# Patient Record
Sex: Male | Born: 1995 | Hispanic: Yes | Marital: Single | Smoking: Former smoker
Health system: Southern US, Community
[De-identification: ages and names within clinical notes are randomized; demographics above are authoritative.]

## PROBLEM LIST (undated history)

## (undated) DIAGNOSIS — Z789 Other specified health status: Secondary | ICD-10-CM

## (undated) HISTORY — PX: NO PAST SURGERIES: SHX2092

---
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
## 2013-06-29 ENCOUNTER — Emergency Department: Payer: Self-pay | Admitting: Emergency Medicine

## 2013-08-05 ENCOUNTER — Emergency Department: Payer: Self-pay | Admitting: Emergency Medicine

## 2015-05-25 ENCOUNTER — Emergency Department: Payer: Self-pay

## 2015-05-25 DIAGNOSIS — S82832A Other fracture of upper and lower end of left fibula, initial encounter for closed fracture: Secondary | ICD-10-CM | POA: Insufficient documentation

## 2015-05-25 DIAGNOSIS — X58XXXA Exposure to other specified factors, initial encounter: Secondary | ICD-10-CM | POA: Insufficient documentation

## 2015-05-25 DIAGNOSIS — Y92322 Soccer field as the place of occurrence of the external cause: Secondary | ICD-10-CM | POA: Insufficient documentation

## 2015-05-25 DIAGNOSIS — Y9366 Activity, soccer: Secondary | ICD-10-CM | POA: Insufficient documentation

## 2015-05-25 DIAGNOSIS — Y998 Other external cause status: Secondary | ICD-10-CM | POA: Insufficient documentation

## 2015-05-25 DIAGNOSIS — S8252XA Displaced fracture of medial malleolus of left tibia, initial encounter for closed fracture: Secondary | ICD-10-CM | POA: Insufficient documentation

## 2015-05-25 NOTE — ED Notes (Signed)
Pt injured left foot and ankle playing soccer, co pain and swelling to area.

## 2015-05-26 ENCOUNTER — Encounter: Payer: Self-pay | Admitting: Emergency Medicine

## 2015-05-26 ENCOUNTER — Emergency Department
Admission: EM | Admit: 2015-05-26 | Discharge: 2015-05-26 | Disposition: A | Discharge status: Home / Self Care | Payer: Self-pay | Attending: Emergency Medicine | Admitting: Emergency Medicine

## 2015-05-26 DIAGNOSIS — S82892A Other fracture of left lower leg, initial encounter for closed fracture: Secondary | ICD-10-CM

## 2015-05-26 MED ORDER — OXYCODONE-ACETAMINOPHEN 5-325 MG PO TABS: 1.0000 | ORAL_TABLET | ORAL | Status: AC | PRN

## 2015-05-26 MED ORDER — OXYCODONE-ACETAMINOPHEN 5-325 MG PO TABS
2.0000 | ORAL_TABLET | Freq: Once | ORAL | Status: AC
  Administered 2015-05-26: 2 via ORAL
  Filled 2015-05-26: qty 2

## 2015-05-26 NOTE — Discharge Instructions (Signed)
Fractura de tobillo °(Ankle Fracture) °Una fractura es la ruptura de un hueso. La articulación del tobillo está compuesta por tres huesos. Estos incluyen las secciones inferiores (distales) de los huesos de la extremidad inferior, llamados tibia y peroné, junto con un hueso del pie, llamado astrágalo. En función de la gravedad de la fractura y si hay más de un hueso de la articulación del tobillo fracturado, se usa un yeso o una férula para proteger el hueso fracturado e impedir que este se mueva mientras se suelda. A veces, es necesario realizar la cirugía para ayudar a que la fractura suelde correctamente.  °Hay dos tipos generales de fracturas: °· Fractura estable. En este tipo de fractura hay una sola línea de la fractura que atraviesa un hueso sin lesiones en los ligamentos del tobillo. La fractura del astrágalo sin desplazamiento (movimiento del hueso hacia uno de los lados de la línea de la fractura) también es estable. °· Fractura inestable. En este tipo de fractura hay más de una línea de la fractura que atraviesan uno o más huesos de la articulación del tobillo. También incluye las fracturas con desplazamiento del hueso hacia uno de los lados de la línea de la fractura. °CAUSAS °· Un golpe directo en el tobillo. °· Una torcedura rápida y grave del tobillo. °· Un traumatismo, como un accidente automovilístico o una caída de una altura importante. °FACTORES DE RIESGO °Puede tener un riesgo más alto de sufrir una fractura de tobillo si: °· Tiene ciertas enfermedades crónicas. °· Practica deportes de alto impacto. °· Tiene un accidente automovilístico de alto impacto. °SIGNOS Y SÍNTOMAS  °· Dolor e hinchazón en el tobillo. °· Hematomas alrededor del tobillo lesionado. °· Dolor al mover el tobillo. °· Dificultad para caminar o para soportar peso en el tobillo. °· Pie frío por debajo del lugar de la lesión del tobillo. Esto puede ocurrir si los vasos sanguíneos que atraviesan el tobillo lesionado también se  dañaron. °· Adormecimiento del pie por debajo del lugar de la lesión del tobillo. °DIAGNÓSTICO  °Generalmente, la fractura de tobillo se diagnostica mediante un examen físico y radiografías. También puede ser necesario realizar una tomografía computarizada si la fractura es compleja. °TRATAMIENTO  °Para tratar las fracturas estables, se coloca un yeso o una férula y se usan muletas para no recargar el tobillo lesionado. A esto le sigue un programa de fortalecimiento del tobillo. Algunos pacientes necesitan un tipo especial de yeso, en función de otros problemas médicos que pueden tener. Las fracturas inestables requieren cirugía para asegurarse de que los huesos se suelden correctamente. El médico le informará qué tipo de fractura tiene y cuál es el mejor tratamiento para su afección. °INSTRUCCIONES PARA EL CUIDADO EN EL HOGAR  °· Revise con el médico cuál es la mejor forma de usar las muletas y úselas como se lo indiquen. El uso seguro de las muletas es muy importante. El uso indebido de las muletas puede provocarle caídas o causar lesiones en los nervios de las manos o las axilas. °· No recargue ni ejerza presión en el tobillo lesionado hasta tanto el médico se lo indique. °· Para disminuir la hinchazón, mantenga elevada la pierna lesionada mientras está sentado o acostado. °· Aplique hielo sobre la zona lesionada. °¨ Ponga el hielo en una bolsa plástica. °¨ Coloque una toalla entre el yeso y la bolsa de hielo. °¨ Deje el hielo durante 20 minutos, 2 a 3 veces por día. °· Si le colocaron un yeso o un molde de fibra de vidrio: °¨   No trate de rascarse la piel por debajo del yeso con ningún objeto. Esto puede aumentar el riesgo de infecciones cutáneas. °¨ Controle todos los días la piel de alrededor del yeso. Puede colocarse una loción en las zonas rojas o doloridas. °¨ Mantenga el yeso seco y limpio. °· Si tiene una férula de yeso: °¨ Se la férula del modo en que se lo indicaron. °¨ Puede aflojar el elástico que  rodea la férula si los dedos se entumecen, siente hormigueos, se enfrían o se vuelven de color azul. °· No ejerza presión en ninguna parte del yeso o férula; podría romperse. Durante las primeras 24 horas mantenga el yeso sobre una almohada hasta que esté completamente duro. °· Es posible proteger el yeso o la férula durante el baño con una bolsa de plástico sellada sobre la piel con cinta adhesiva. No los sumerja en el agua. °· Tome todos los medicamentos como le indicó el médico. Utilice los medicamentos de venta libre o recetados para calmar el dolor, el malestar o la fiebre, según se lo indique el médico. °· No conduzca vehículos hasta que el médico le diga específicamente que puede hacerlo con seguridad. °· Si el médico le ha dado fecha para una visita de control, es importante que concurra. No concurrir a la visita puede derivar en que el daño, el dolor o la discapacidad sean permanentes o crónicos. Si tiene problemas para cumplir con la visita, llame al centro para pedir ayuda. °SOLICITE ATENCIÓN MÉDICA SI: °Aumenta la hinchazón o la molestia. °SOLICITE ATENCIÓN MÉDICA DE INMEDIATO SI:  °· Su yeso se daña o se rompe. °· Tiene dolor intenso y continuo. °· Siente un nuevo dolor o presenta hinchazón después de la colocación del yeso. °· La piel o las uñas del pie que están por debajo de la lesión se le ponen azules o grises. °· La piel o las uñas del pie que están por debajo de la lesión están frías, adormecidas o pierde la sensibilidad al tacto. °· Siente mal olor u observa secreción debajo del yeso. °ASEGÚRESE DE QUE:  °· Comprende estas instrucciones. °· Controlará su afección. °· Recibirá ayuda de inmediato si no mejora o si empeora. °Document Released: 09/03/2005 Document Revised: 09/08/2013 °[employer]® Patient Information ©2015 [employer]. This information is not intended to replace advice given to you by your health care provider. Make sure you discuss any questions you have with your health care  provider. ° °

## 2015-05-26 NOTE — ED Provider Notes (Signed)
[facility] Department Provider Note  ____________________________________________  Time seen: 12:55AM  I have reviewed the triage vital signs and the nursing notes.   HISTORY  Chief Complaint Ankle Pain     HPI Keith Jacobson is a 19 y.o. male presents with history of injuring his left foot/ ankle while playing soccer today. Patient admits to 10 out of 10 pain and swelling diffusely around his left ankle. Patient denies any other pain no other injury.   Past medical history None There are no active problems to display for this patient.   Past surgical history None No current outpatient prescriptions on file.  Allergies No known drug allergies History reviewed. No pertinent family history.  Social History Social History  Substance Use Topics  . Smoking status: Never Smoker   . Smokeless tobacco: Never Used  . Alcohol Use: None    Review of Systems  Constitutional: Negative for fever. Eyes: Negative for visual changes. ENT: Negative for sore throat. Cardiovascular: Negative for chest pain. Respiratory: Negative for shortness of breath. Gastrointestinal: Negative for abdominal pain, vomiting and diarrhea. Genitourinary: Negative for dysuria. Musculoskeletal: Negative for back pain. Positive for left ankle pain and swelling Skin: Negative for rash. Neurological: Negative for headaches, focal weakness or numbness.   10-point ROS otherwise negative.  ____________________________________________   PHYSICAL EXAM:  VITAL SIGNS: ED Triage Vitals  Enc Vitals Group     BP 05/25/15 2222 122/106 mmHg     Pulse Rate 05/25/15 2222 79     Resp 05/25/15 2222 18     Temp 05/25/15 2222 100.2 F (37.9 C)     Temp Source 05/25/15 2222 Oral     SpO2 05/25/15 2222 97 %     Weight 05/25/15 2222 235 lb (106.595 kg)     Height 05/25/15 2222 6' (1.829 m)     Head Cir --      Peak Flow --      Pain Score 05/25/15 2223 8     Pain  Loc --      Pain Edu? --      Excl. in GC? --      Constitutional: Alert and oriented. Apparent discomfort Eyes: Conjunctivae are normal. PERRL. Normal extraocular movements. ENT   Head: Normocephalic and atraumatic.   Nose: No congestion/rhinnorhea.   Mouth/Throat: Mucous membranes are moist.   Neck: No stridor. Hematological/Lymphatic/Immunilogical: No cervical lymphadenopathy. Cardiovascular: Normal rate, regular rhythm. Normal and symmetric distal pulses are present in all extremities. No murmurs, rubs, or gallops. Respiratory: Normal respiratory effort without tachypnea nor retractions. Breath sounds are clear and equal bilaterally. No wheezes/rales/rhonchi. Gastrointestinal: Soft and nontender. No distention. There is no CVA tenderness. Genitourinary: deferred Musculoskeletal: Pain to palpation medial and lateral malleoli with noticeable swelling Neurologic:  Normal speech and language. No gross focal neurologic deficits are appreciated. Speech is normal.  Skin:  Skin is warm, dry and intact. No rash noted. Psychiatric: Mood and affect are normal. Speech and behavior are normal. Patient exhibits appropriate insight and judgment.      RADIOLOGY     DG Ankle Complete Left (Final result) Result time: 05/25/15 22:53:42   Final result by Rad Results In Interface (05/25/15 22:53:42)   Narrative:   CLINICAL DATA: LEFT foot and ankle injury while playing soccer tonight, pain and swelling.  EXAM: LEFT ANKLE COMPLETE - 3+ VIEW; LEFT FOOT - COMPLETE 3+ VIEW  COMPARISON: None.  FINDINGS: Medial malleolus avulsion injury, predominately involving the medial aspect, sparing of the  distal tip. Distal fibular nondisplaced fracture above the ankle mortise. Mildly impacted posterior distal tibial intra-articular fracture. Anterior widening of the ankle mortise. Lateral clear space appears intact. No destructive bony lesions.  Small plantar calcaneal spur. Ankle  soft tissue swelling without subcutaneous gas or radiopaque foreign bodies.  IMPRESSION: Acute nondisplaced medial and posterior malleolus fractures, acute nondisplaced distal fibula fracture. No dislocation.  Widened ankle mortise most consistent with ligamentous injury.   Electronically Signed By: Awilda Metro M.D. On: 05/25/2015 22:53          DG Foot Complete Left (Final result) Result time: 05/25/15 22:53:42   Final result by Rad Results In Interface (05/25/15 22:53:42)   Narrative:   CLINICAL DATA: LEFT foot and ankle injury while playing soccer tonight, pain and swelling.  EXAM: LEFT ANKLE COMPLETE - 3+ VIEW; LEFT FOOT - COMPLETE 3+ VIEW  COMPARISON: None.  FINDINGS: Medial malleolus avulsion injury, predominately involving the medial aspect, sparing of the distal tip. Distal fibular nondisplaced fracture above the ankle mortise. Mildly impacted posterior distal tibial intra-articular fracture. Anterior widening of the ankle mortise. Lateral clear space appears intact. No destructive bony lesions.  Small plantar calcaneal spur. Ankle soft tissue swelling without subcutaneous gas or radiopaque foreign bodies.  IMPRESSION: Acute nondisplaced medial and posterior malleolus fractures, acute nondisplaced distal fibula fracture. No dislocation.  Widened ankle mortise most consistent with ligamentous injury.   Electronically Signed By: Awilda Metro M.D. On: 05/25/2015 22:53       INITIAL IMPRESSION / ASSESSMENT AND PLAN / ED COURSE  Pertinent labs & imaging results that were available during my care of the patient were reviewed by me and considered in my medical decision making (see chart for details). Patient received Percocet 2 tablets for analgesia Left ankle posterior and sling splint applied. Crutches given  ____________________________________________   FINAL CLINICAL IMPRESSION(S) / ED DIAGNOSES  Final diagnoses:   Closed left ankle fracture, initial encounter      Darci Current, MD 05/26/15 [phone]

## 2015-05-26 NOTE — ED Notes (Signed)
Pt up to desk asking for pain medication. Xray results reviewed and charge nurse called for exam room due to +fx. Pt tearful.

## 2015-05-27 ENCOUNTER — Encounter: Payer: Self-pay | Admitting: *Deleted

## 2015-05-27 ENCOUNTER — Ambulatory Visit: Payer: Self-pay | Admitting: Anesthesiology

## 2015-05-27 ENCOUNTER — Encounter
Admission: RE | Disposition: A | Discharge status: Home / Self Care | Payer: Self-pay | Source: Ambulatory Visit | Attending: Podiatry

## 2015-05-27 ENCOUNTER — Ambulatory Visit
Admission: RE | Admit: 2015-05-27 | Discharge: 2015-05-27 | Disposition: A | Discharge status: Home / Self Care | Payer: Self-pay | Source: Ambulatory Visit | Attending: Podiatry | Admitting: Podiatry

## 2015-05-27 ENCOUNTER — Ambulatory Visit: Payer: Self-pay

## 2015-05-27 DIAGNOSIS — Y9389 Activity, other specified: Secondary | ICD-10-CM | POA: Insufficient documentation

## 2015-05-27 DIAGNOSIS — T148XXA Other injury of unspecified body region, initial encounter: Secondary | ICD-10-CM

## 2015-05-27 DIAGNOSIS — Y9289 Other specified places as the place of occurrence of the external cause: Secondary | ICD-10-CM | POA: Insufficient documentation

## 2015-05-27 DIAGNOSIS — X58XXXA Exposure to other specified factors, initial encounter: Secondary | ICD-10-CM | POA: Insufficient documentation

## 2015-05-27 DIAGNOSIS — S93422A Sprain of deltoid ligament of left ankle, initial encounter: Secondary | ICD-10-CM | POA: Insufficient documentation

## 2015-05-27 DIAGNOSIS — Y998 Other external cause status: Secondary | ICD-10-CM | POA: Insufficient documentation

## 2015-05-27 DIAGNOSIS — S82842A Displaced bimalleolar fracture of left lower leg, initial encounter for closed fracture: Secondary | ICD-10-CM | POA: Insufficient documentation

## 2015-05-27 HISTORY — PX: ORIF ANKLE FRACTURE: SHX5408

## 2015-05-27 HISTORY — DX: Other specified health status: Z78.9

## 2015-05-27 SURGERY — OPEN REDUCTION INTERNAL FIXATION (ORIF) ANKLE FRACTURE
Anesthesia: General | Site: Ankle | Laterality: Left | Wound class: Clean

## 2015-05-27 MED ORDER — BUPIVACAINE HCL (PF) 0.5 % IJ SOLN
INTRAMUSCULAR | Status: AC
  Filled 2015-05-27: qty 30

## 2015-05-27 MED ORDER — LIDOCAINE HCL (CARDIAC) 20 MG/ML IV SOLN
INTRAVENOUS | Status: DC | PRN
  Administered 2015-05-27: 80 mg via INTRAVENOUS

## 2015-05-27 MED ORDER — LACTATED RINGERS IV SOLN
INTRAVENOUS | Status: DC | PRN
  Administered 2015-05-27: 14:00:00 via INTRAVENOUS

## 2015-05-27 MED ORDER — FENTANYL CITRATE (PF) 100 MCG/2ML IJ SOLN
INTRAMUSCULAR | Status: DC | PRN
  Administered 2015-05-27 (×4): 50 ug via INTRAVENOUS

## 2015-05-27 MED ORDER — MIDAZOLAM HCL 2 MG/2ML IJ SOLN
1.0000 mg | Freq: Once | INTRAMUSCULAR | Status: AC
  Administered 2015-05-27: 1 mg via INTRAVENOUS

## 2015-05-27 MED ORDER — LIDOCAINE HCL (PF) 1 % IJ SOLN
INTRAMUSCULAR | Status: AC
  Filled 2015-05-27: qty 30

## 2015-05-27 MED ORDER — CEFAZOLIN SODIUM-DEXTROSE 2-3 GM-% IV SOLR
2.0000 g | Freq: Once | INTRAVENOUS | Status: AC
  Administered 2015-05-27: 2 g via INTRAVENOUS

## 2015-05-27 MED ORDER — NEOMYCIN-POLYMYXIN B GU 40-200000 IR SOLN
Status: AC
  Filled 2015-05-27: qty 4

## 2015-05-27 MED ORDER — BUPIVACAINE HCL 0.5 % IJ SOLN
INTRAMUSCULAR | Status: DC | PRN
  Administered 2015-05-27: 27 mL

## 2015-05-27 MED ORDER — MIDAZOLAM HCL 5 MG/5ML IJ SOLN
INTRAMUSCULAR | Status: AC
  Administered 2015-05-27: 1 mg via INTRAVENOUS
  Filled 2015-05-27: qty 5

## 2015-05-27 MED ORDER — FENTANYL CITRATE (PF) 100 MCG/2ML IJ SOLN
INTRAMUSCULAR | Status: AC
  Administered 2015-05-27: 50 ug via INTRAVENOUS
  Filled 2015-05-27: qty 2

## 2015-05-27 MED ORDER — FENTANYL CITRATE (PF) 100 MCG/2ML IJ SOLN: 25.0000 ug | INTRAMUSCULAR | Status: DC | PRN

## 2015-05-27 MED ORDER — CEFAZOLIN SODIUM-DEXTROSE 2-3 GM-% IV SOLR
INTRAVENOUS | Status: DC
Start: 2015-05-27 — End: 2015-05-27
  Filled 2015-05-27: qty 50

## 2015-05-27 MED ORDER — KETOROLAC TROMETHAMINE 30 MG/ML IJ SOLN
INTRAMUSCULAR | Status: DC | PRN
  Administered 2015-05-27: 30 mg via INTRAVENOUS

## 2015-05-27 MED ORDER — GLYCOPYRROLATE 0.2 MG/ML IJ SOLN
INTRAMUSCULAR | Status: DC | PRN
  Administered 2015-05-27: 0.2 mg via INTRAVENOUS

## 2015-05-27 MED ORDER — ONDANSETRON HCL 4 MG/2ML IJ SOLN
INTRAMUSCULAR | Status: DC | PRN
  Administered 2015-05-27: 4 mg via INTRAVENOUS

## 2015-05-27 MED ORDER — PROPOFOL 10 MG/ML IV BOLUS
INTRAVENOUS | Status: DC | PRN
  Administered 2015-05-27: 150 mg via INTRAVENOUS
  Administered 2015-05-27: 50 mg via INTRAVENOUS

## 2015-05-27 MED ORDER — MIDAZOLAM HCL 5 MG/5ML IJ SOLN
1.0000 mg | Freq: Once | INTRAMUSCULAR | Status: AC
  Administered 2015-05-27: 1 mg via INTRAVENOUS

## 2015-05-27 MED ORDER — LACTATED RINGERS IV SOLN: INTRAVENOUS | Status: DC

## 2015-05-27 MED ORDER — FENTANYL CITRATE (PF) 100 MCG/2ML IJ SOLN
50.0000 ug | Freq: Once | INTRAMUSCULAR | Status: AC
  Administered 2015-05-27: 50 ug via INTRAVENOUS

## 2015-05-27 MED ORDER — ONDANSETRON HCL 4 MG/2ML IJ SOLN: 4.0000 mg | Freq: Once | INTRAMUSCULAR | Status: DC | PRN

## 2015-05-27 SURGICAL SUPPLY — 47 items
BAG COUNTER SPONGE EZ (MISCELLANEOUS) IMPLANT
BANDAGE ELASTIC 4 CLIP NS LF (GAUZE/BANDAGES/DRESSINGS) ×2 IMPLANT
BANDAGE STRETCH 3X4.1 STRL (GAUZE/BANDAGES/DRESSINGS) ×4 IMPLANT
BIT DRILL CALIBRATED 2.7 (BIT) ×2 IMPLANT
BNDG COHESIVE 4X5 TAN STRL (GAUZE/BANDAGES/DRESSINGS) ×2 IMPLANT
BNDG ESMARK 4X12 TAN STRL LF (GAUZE/BANDAGES/DRESSINGS) ×2 IMPLANT
BNDG GAUZE 4.5X4.1 6PLY STRL (MISCELLANEOUS) ×4 IMPLANT
CANISTER SUCT 1200ML W/VALVE (MISCELLANEOUS) ×2 IMPLANT
DRAPE C-ARM XRAY 36X54 (DRAPES) ×2 IMPLANT
DRAPE C-ARMOR (DRAPES) ×4 IMPLANT
DRAPE TABLE BACK 80X90 (DRAPES) ×2 IMPLANT
DURAPREP 26ML APPLICATOR (WOUND CARE) ×4 IMPLANT
GAUZE PETRO XEROFOAM 1X8 (MISCELLANEOUS) ×4 IMPLANT
GAUZE SPONGE 4X4 12PLY STRL (GAUZE/BANDAGES/DRESSINGS) ×4 IMPLANT
GAUZE STRETCH 2X75IN STRL (MISCELLANEOUS) ×2 IMPLANT
GLOVE BIO SURGEON STRL SZ7.5 (GLOVE) ×2 IMPLANT
GLOVE INDICATOR 8.0 STRL GRN (GLOVE) ×2 IMPLANT
GOWN STRL REUS W/ TWL LRG LVL3 (GOWN DISPOSABLE) ×2 IMPLANT
GOWN STRL REUS W/TWL LRG LVL3 (GOWN DISPOSABLE) ×2
K-WIRE ACE 1.6X6 (WIRE) ×2
KWIRE ACE 1.6X6 (WIRE) ×1 IMPLANT
LABEL OR SOLS (LABEL) ×2 IMPLANT
NS IRRIG 500ML POUR BTL (IV SOLUTION) ×2 IMPLANT
PACK EXTREMITY ARMC (MISCELLANEOUS) ×2 IMPLANT
PAD CAST CTTN 4X4 STRL (SOFTGOODS) ×1 IMPLANT
PAD GROUND ADULT SPLIT (MISCELLANEOUS) ×2 IMPLANT
PAD PREP 24X41 OB/GYN DISP (PERSONAL CARE ITEMS) ×2 IMPLANT
PADDING CAST COTTON 4X4 STRL (SOFTGOODS) ×1
PLATE LOCK 7H 92 BILAT FIB (Plate) ×2 IMPLANT
SCREW LOCK CORT STAR 3.5X10 (Screw) ×2 IMPLANT
SCREW LOCK CORT STAR 3.5X12 (Screw) ×6 IMPLANT
SCREW LOCK CORT STAR 3.5X14 (Screw) ×2 IMPLANT
SCREW LOW PROFILE 18MMX3.5MM (Screw) ×2 IMPLANT
SCREW NON LOCKING LP 3.5 14MM (Screw) ×2 IMPLANT
SPLINT CAST 1 STEP 4X30 (MISCELLANEOUS) IMPLANT
SPLINT FAST PLASTER 5X30 (CAST SUPPLIES)
SPLINT PLASTER CAST FAST 5X30 (CAST SUPPLIES) IMPLANT
SPONGE LAP 18X18 5 PK (GAUZE/BANDAGES/DRESSINGS) ×2 IMPLANT
STAPLER SKIN PROX 35W (STAPLE) ×4 IMPLANT
STOCKINETTE M/LG 89821 (MISCELLANEOUS) ×2 IMPLANT
STRAP SAFETY BODY (MISCELLANEOUS) ×2 IMPLANT
SUT PDS 2-0 27IN (SUTURE) ×2 IMPLANT
SUT VIC AB 2-0 CT1 27 (SUTURE) ×1
SUT VIC AB 2-0 CT1 TAPERPNT 27 (SUTURE) ×1 IMPLANT
SUT VIC AB 3-0 SH 27 (SUTURE) ×2
SUT VIC AB 3-0 SH 27X BRD (SUTURE) ×2 IMPLANT
SYRINGE 10CC LL (SYRINGE) ×2 IMPLANT

## 2015-05-27 NOTE — Anesthesia Procedure Notes (Signed)
Procedure Name: LMA Insertion Date/Time: 05/27/2015 2:02 PM Performed by: Irving Burton Pre-anesthesia Checklist: Patient identified, Emergency Drugs available, Suction available and Patient being monitored Patient Re-evaluated:Patient Re-evaluated prior to inductionOxygen Delivery Method: Circle system utilized Preoxygenation: Pre-oxygenation with 100% oxygen Intubation Type: IV induction Ventilation: Mask ventilation without difficulty LMA: LMA inserted LMA Size: 4.5 Number of attempts: 1 Airway Equipment and Method: Patient positioned with wedge pillow Placement Confirmation: positive ETCO2 and breath sounds checked- equal and bilateral Tube secured with: Tape Dental Injury: Teeth and Oropharynx as per pre-operative assessment

## 2015-05-27 NOTE — Discharge Instructions (Signed)
[facility] °[facility] ° °POST OPERATIVE INSTRUCTIONS FOR DR. TROXLER AND DR. Eriel Dunckel °[facility] ° ° °1. Take your medication as prescribed.  Pain medication should be taken only as needed. ° °2. Keep the dressing clean, dry and intact. ° °3. Keep your foot elevated above the heart level for the first 48 hours. ° °4. Walking to the bathroom and brief periods of walking are acceptable, unless we have instructed you to be non-weight bearing. ° °5. Always wear your post-op shoe when walking.  Always use your crutches if you are to be non-weight bearing. ° °6. Do not take a shower. Baths are permissible as long as the foot is kept out of the water.  ° °7. Every hour you are awake:  °- Bend your knee 15 times. °- Flex foot 15 times °- Massage calf 15 times ° °8. Call [facility] ([phone]) if any of the following problems occur: °- You develop a temperature or fever. °- The bandage becomes saturated with blood. °- Medication does not stop your pain. °- Injury of the foot occurs. °- Any symptoms of infection including redness, odor, or red streaks running from wound. °-  ° °

## 2015-05-27 NOTE — Op Note (Signed)
Operative note   Surgeon:Emmerich Cryer [employer] forces logistics/support/administrative officer: None    Preop diagnosis: 1.  Left fibula fracture  2.  Deltoid ligament tear medial ankle    Postop diagnosis: Same    Procedure:1. ORIF left fibular fracture  2.  Open deltoid ligament repair    EBL: Minimal    Anesthesia:regional and general    Hemostasis: Thigh tourniquet inflated to 325 mmHg for approximately 70    Specimen: None    Complications: None    Operative indications: 19 year old gentleman who sustained a bimalleolar equivalent left ankle fracture. He presented stay for surgical intervention. The risks and benefits alternatives and complications were discussed with patient the patient in full and consent has been given    Procedure:  Patient was brought into the OR and placed on the operating table in thesupine position. After anesthesia was obtained theleft lower extremity was prepped and draped in usual sterile fashion.  Attention was directed to the distal lateral fibula where a longitudinal incision was performed. Sharp and blunt dissection carried down to the periosteum. Subperiosteal dissection was then undertaken. The long distal fibular oblique fracture was noted. Right posterior and proximal displacement was noted. Was initially flushed with copious irrigation. Excellent the bone reduction clamp was able to realign the fracture site to anatomically aligned position. Next a atomic distal fibular plate was placed on the lateral aspect of the fibula. Initially the plate was contoured against the bone with a nonlocking 3.5 screw. Proximal and distal to the fracture site the plate was filled with 3.5 mm locking screws. A single nonlocking screw was placed proximal to the  fracture site prior to locking screw placement. Excellent alignment and stability was then noted.  Attention was directed to the medial aspect of the ankle where the medial deltoid incision was made. Sharp and blunt dissection carried down to the  deltoid ligament rupture. Care was taken to retract the saphenous vein that was noted throughout the procedure. The deltoid ligament was noted and repaired with a 2-0 PDS. Debility was noted. The medial gutter was of any scar tissue. Later skin closed with simple form with a 3-0 Vicryl for the deeper layers and a skin staple for the medial incision site. When 3-0 Vicryl was used for the lateral incision and skin staples for skin. 0.5% Marcaine was placed from all areas.    Patient tolerated the procedure and anesthesia well.  Was transported from the OR to the PACU with all vital signs stable and vascular status intact. To be discharged per routine protocol.  Will follow up in approximately 1 week in the outpatient clinic. He is to remain nonweightbearing until further evaluation.

## 2015-05-27 NOTE — H&P (Signed)
  HISTORY AND PHYSICAL INTERVAL NOTE:  05/27/2015  1:12 PM  Keith Jacobson  has presented today for surgery, with the diagnosis of left ANKLE FRACTURE.  The various methods of treatment have been discussed with the patient.  No guarantees were given.  After consideration of risks, benefits and other options for treatment, the patient has consented to surgery.  I have reviewed the patients' chart and labs.    Patient Vitals for the past 24 hrs:  BP Temp Temp src Pulse Resp SpO2 Height Weight  05/27/15 1223 123/82 mmHg 98.5 F (36.9 C) Oral 76 18 98 % 6' (1.829 m) 106.595 kg (235 lb)    A history and physical examination was performed in my office.  The patient was reexamined.  There have been no changes to this history and physical examination.  Gwyneth Revels A

## 2015-05-27 NOTE — Anesthesia Preprocedure Evaluation (Signed)
Anesthesia Evaluation  Patient identified by MRN, date of birth, ID band Patient awake    Reviewed: Allergy & Precautions, NPO status , Patient's Chart, lab work & pertinent test results, reviewed documented beta blocker date and time   Airway Mallampati: II  TM Distance: >3 FB     Dental  (+) Chipped   Pulmonary           Cardiovascular      Neuro/Psych    GI/Hepatic   Endo/Other    Renal/GU      Musculoskeletal   Abdominal   Peds  Hematology   Anesthesia Other Findings   Reproductive/Obstetrics                             Anesthesia Physical Anesthesia Plan  ASA: II  Anesthesia Plan: General   Post-op Pain Management: GA combined w/ Regional for post-op pain   Induction: Intravenous  Airway Management Planned: Oral ETT  Additional Equipment:   Intra-op Plan:   Post-operative Plan:   Informed Consent: I have reviewed the patients History and Physical, chart, labs and discussed the procedure including the risks, benefits and alternatives for the proposed anesthesia with the patient or authorized representative who has indicated his/her understanding and acceptance.     Plan Discussed with: CRNA  Anesthesia Plan Comments:         Anesthesia Quick Evaluation

## 2015-05-27 NOTE — Anesthesia Postprocedure Evaluation (Signed)
  Anesthesia Post-op Note  Patient: Keith Jacobson  Procedure(s) Performed: Procedure(s): OPEN REDUCTION INTERNAL FIXATION (ORIF) ANKLE FRACTURE (Left)  Anesthesia type:General  Patient location: PACU  Post pain: Pain level controlled  Post assessment: Post-op Vital signs reviewed, Patient's Cardiovascular Status Stable, Respiratory Function Stable, Patent Airway and No signs of Nausea or vomiting  Post vital signs: Reviewed and stable  Last Vitals:  Filed Vitals:   05/27/15 1722  BP: 124/66  Pulse: 68  Temp:   Resp: 18    Level of consciousness: awake, alert  and patient cooperative  Complications: No apparent anesthesia complications

## 2015-05-27 NOTE — Transfer of Care (Signed)
Immediate Anesthesia Transfer of Care Note  Patient: Keith Jacobson  Procedure(s) Performed: Procedure(s): OPEN REDUCTION INTERNAL FIXATION (ORIF) ANKLE FRACTURE (Left)  Patient Location: PACU  Anesthesia Type:General  Level of Consciousness: sedated  Airway & Oxygen Therapy: Patient Spontanous Breathing and Patient connected to face mask oxygen  Post-op Assessment: Report given to RN and Post -op Vital signs reviewed and stable  Post vital signs: Reviewed and stable  Last Vitals:  Filed Vitals:   05/27/15 1542  BP: 106/50  Pulse: 78  Temp: 36.6 C  Resp: 19    Complications: No apparent anesthesia complications

## 2015-05-30 ENCOUNTER — Encounter: Payer: Self-pay | Admitting: Podiatry

## 2016-05-23 ENCOUNTER — Emergency Department: Payer: Self-pay

## 2016-05-23 ENCOUNTER — Emergency Department
Admission: EM | Admit: 2016-05-23 | Discharge: 2016-05-23 | Disposition: A | Discharge status: Home / Self Care | Payer: Self-pay | Attending: Student in an Organized Health Care Education/Training Program | Admitting: Student in an Organized Health Care Education/Training Program

## 2016-05-23 DIAGNOSIS — Y999 Unspecified external cause status: Secondary | ICD-10-CM | POA: Insufficient documentation

## 2016-05-23 DIAGNOSIS — Z8781 Personal history of (healed) traumatic fracture: Secondary | ICD-10-CM | POA: Insufficient documentation

## 2016-05-23 DIAGNOSIS — S93402A Sprain of unspecified ligament of left ankle, initial encounter: Secondary | ICD-10-CM | POA: Insufficient documentation

## 2016-05-23 DIAGNOSIS — X503XXA Overexertion from repetitive movements, initial encounter: Secondary | ICD-10-CM | POA: Insufficient documentation

## 2016-05-23 DIAGNOSIS — Y9289 Other specified places as the place of occurrence of the external cause: Secondary | ICD-10-CM | POA: Insufficient documentation

## 2016-05-23 DIAGNOSIS — Y9301 Activity, walking, marching and hiking: Secondary | ICD-10-CM | POA: Insufficient documentation

## 2016-05-23 MED ORDER — MELOXICAM 15 MG PO TABS: 15.0000 mg | ORAL_TABLET | Freq: Every day | ORAL | 0 refills | Status: DC

## 2016-05-23 NOTE — ED Notes (Signed)
Pt. States turning ankle while walking on some rocks yesterday.  Pt. States increased pain to outside of lt. Ankle.  Minor swelling noted to lt. Ankle.  Pt. States hx of fracture to lt. Ankle.

## 2016-05-23 NOTE — ED Triage Notes (Signed)
Pt was walking rocks yesterday and felt pain to left ankle, denies any other injury. Pt has hx of left ankle fracture with repair 1 year ago.

## 2016-05-23 NOTE — ED Provider Notes (Signed)
[facility] Emergency Department Provider Note  ____________________________________________  Time seen: Approximately 11:04 PM  I have reviewed the triage vital signs and the nursing notes.   HISTORY  Chief Complaint Ankle Pain    HPI Keith Jacobson is a 20 y.o. male who presents emergency department complaining of left ankle pain. Patient had ORIF ankle surgery approximately a year ago from a distal fibular fracture. Patient reports that he has had no problems since surgery. Yesterday he was walking on some large rocks when his ankle rolled. Patient is now having pain to the lateral aspect of the ankle. Patient states that he is able to put weight on ankle but not able to walk well on same. Patient reports that the pain is both an aching and sharp sensation. No medications for this complaint prior to arrival. He reports mild swelling to his ankle. No other injury or complaint.   Past Medical History:  Diagnosis Date  . Medical history non-contributory     There are no active problems to display for this patient.   Past Surgical History:  Procedure Laterality Date  . NO PAST SURGERIES    . ORIF ANKLE FRACTURE Left 05/27/2015   Procedure: OPEN REDUCTION INTERNAL FIXATION (ORIF) ANKLE FRACTURE;  Surgeon: Gwyneth RevelsJustin Fowler, DPM;  Location: [facility] ORS;  Service: Podiatry;  Laterality: Left;    Prior to Admission medications   Medication Sig Start Date End Date Taking? Authorizing Provider  meloxicam (MOBIC) 15 MG tablet Take 1 tablet (15 mg total) by mouth daily. 05/23/16   Delorise RoyalsJonathan D Ila Landowski, PA-C  oxyCODONE-acetaminophen (PERCOCET/ROXICET) 5-325 MG per tablet Take 1 tablet by mouth every 4 (four) hours as needed for severe pain. 05/26/15   Darci Currentandolph N Brown, MD    Allergies Review of patient's allergies indicates no known allergies.  No family history on file.  Social History Social History  Substance Use Topics  . Smoking status: Never Smoker  .  Smokeless tobacco: Never Used  . Alcohol use No     Review of Systems  Constitutional: No fever/chills Cardiovascular: no chest pain. Respiratory: no cough. No SOB. Musculoskeletal: Positive for left ankle pain Skin: Negative for rash, abrasions, lacerations, ecchymosis. Neurological: Negative for headaches, focal weakness or numbness. 10-point ROS otherwise negative.  ____________________________________________   PHYSICAL EXAM:  VITAL SIGNS: ED Triage Vitals  Enc Vitals Group     BP 05/23/16 2218 (!) 158/79     Pulse Rate 05/23/16 2218 65     Resp 05/23/16 2218 18     Temp 05/23/16 2218 98.2 F (36.8 C)     Temp Source 05/23/16 2218 Oral     SpO2 05/23/16 2218 99 %     Weight 05/23/16 2219 230 lb (104.3 kg)     Height 05/23/16 2219 6' (1.829 m)     Head Circumference --      Peak Flow --      Pain Score 05/23/16 2219 8     Pain Loc --      Pain Edu? --      Excl. in GC? --      Constitutional: Alert and oriented. Well appearing and in no acute distress. Eyes: Conjunctivae are normal. PERRL. EOMI. Head: Atraumatic. Cardiovascular: Normal rate, regular rhythm. Normal S1 and S2.  Good peripheral circulation. Respiratory: Normal respiratory effort without tachypnea or retractions. Lungs CTAB. Good air entry to the bases with no decreased or absent breath sounds. Musculoskeletal: Full range of motion to all extremities. No gross  deformities appreciated.Mild edema noted to the lateral aspect of the left ankle. Full range of motion to left ankle. Patient is diffusely tender to palpation over the musculature and osseous structures at the left lateral ankle. No specific point tenderness. No palpable abnormality. Dorsalis pedis pulse intact. Sensation intact times all digits. Neurologic:  Normal speech and language. No gross focal neurologic deficits are appreciated.  Skin:  Skin is warm, dry and intact. No rash noted. Psychiatric: Mood and affect are normal. Speech and  behavior are normal. Patient exhibits appropriate insight and judgement.   ____________________________________________   LABS (all labs ordered are listed, but only abnormal results are displayed)  Labs Reviewed - No data to display ____________________________________________  EKG   ____________________________________________  RADIOLOGY Festus Barren Alechia Lezama, personally viewed and evaluated these images (plain radiographs) as part of my medical decision making, as well as reviewing the written report by the radiologist.  Dg Ankle Complete Left  Result Date: 05/23/2016 CLINICAL DATA:  Ankle pain EXAM: LEFT ANKLE COMPLETE - 3+ VIEW COMPARISON:  Left ankle radiographs 05/27/2015 and 05/25/2015 FINDINGS: There is screw and plate fixation of the distal left fibula. 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 No evidence of loosening or infection of the hardware. No periprosthetic fracture. Ankle mortise is aligned. No ankle effusion. IMPRESSION: No acute fracture or dislocation of the left ankle. No focal hardware abnormality. Electronically Signed   By: Deatra Robinson M.D.   On: 05/23/2016 22:59    ____________________________________________    PROCEDURES  Procedure(s) performed:    Procedures    Medications - No data to display   ____________________________________________   INITIAL IMPRESSION / ASSESSMENT AND PLAN / ED COURSE  Pertinent labs & imaging results that were available during my care of the patient were reviewed by me and considered in my medical decision making (see chart for details).  Review of the [place] CSRS was performed in accordance of the [facility] prior to dispensing any controlled drugs.  Clinical Course    Patient's diagnosis is consistent with Left ankle sprain. Exam is reassuring. Troponin reveals no acute osseous abnormality. No indication for complete tear of ligaments or tendons.. Patient's ankle was wrapped  with an Ace bandage and he is given crutches for ambulation. Patient will be discharged home with prescriptions for anti-inflammatory. Patient is to follow up with podiatry as needed or otherwise directed. Patient is given ED precautions to return to the ED for any worsening or new symptoms.     ____________________________________________  FINAL CLINICAL IMPRESSION(S) / ED DIAGNOSES  Final diagnoses:  Ankle sprain, left, initial encounter      NEW MEDICATIONS STARTED DURING THIS VISIT:  New Prescriptions   MELOXICAM (MOBIC) 15 MG TABLET    Take 1 tablet (15 mg total) by mouth daily.        This chart was dictated using voice recognition software/Dragon. Despite best efforts to proofread, errors can occur which can change the meaning. Any change was purely unintentional.    Racheal Patches, PA-C 05/23/16 2324    Willy Eddy, MD 05/23/16 [id number]

## 2016-05-23 NOTE — ED Notes (Signed)
Pt. Going home with crutches

## 2016-06-22 MED ORDER — SILVER NITRATE-POT NITRATE 75-25 % EX MISC
CUTANEOUS | Status: AC
  Filled 2016-06-22: qty 3

## 2017-10-14 ENCOUNTER — Emergency Department
Admission: EM | Admit: 2017-10-14 | Discharge: 2017-10-14 | Disposition: A | Discharge status: Home / Self Care | Payer: Self-pay | Attending: Emergency Medicine | Admitting: Emergency Medicine

## 2017-10-14 ENCOUNTER — Encounter: Payer: Self-pay | Admitting: Emergency Medicine

## 2017-10-14 ENCOUNTER — Other Ambulatory Visit: Payer: Self-pay

## 2017-10-14 DIAGNOSIS — Z79899 Other long term (current) drug therapy: Secondary | ICD-10-CM | POA: Insufficient documentation

## 2017-10-14 DIAGNOSIS — L02212 Cutaneous abscess of back [any part, except buttock]: Secondary | ICD-10-CM | POA: Insufficient documentation

## 2017-10-14 DIAGNOSIS — L0291 Cutaneous abscess, unspecified: Secondary | ICD-10-CM

## 2017-10-14 MED ORDER — SULFAMETHOXAZOLE-TRIMETHOPRIM 800-160 MG PO TABS
1.0000 | ORAL_TABLET | Freq: Once | ORAL | Status: AC
Start: 2017-10-14 — End: 2017-10-14
  Administered 2017-10-14: 1 via ORAL
  Filled 2017-10-14: qty 1

## 2017-10-14 MED ORDER — OXYCODONE-ACETAMINOPHEN 5-325 MG PO TABS
1.0000 | ORAL_TABLET | Freq: Once | ORAL | Status: AC
  Administered 2017-10-14: 1 via ORAL
  Filled 2017-10-14: qty 1

## 2017-10-14 MED ORDER — IBUPROFEN 800 MG PO TABS: 800.0000 mg | ORAL_TABLET | Freq: Three times a day (TID) | ORAL | 0 refills | Status: AC | PRN

## 2017-10-14 MED ORDER — IBUPROFEN 800 MG PO TABS
800.0000 mg | ORAL_TABLET | Freq: Once | ORAL | Status: AC
  Administered 2017-10-14: 800 mg via ORAL
  Filled 2017-10-14: qty 1

## 2017-10-14 MED ORDER — OXYCODONE-ACETAMINOPHEN 7.5-325 MG PO TABS: 1.0000 | ORAL_TABLET | Freq: Four times a day (QID) | ORAL | 0 refills | Status: AC | PRN

## 2017-10-14 MED ORDER — LIDOCAINE HCL (PF) 1 % IJ SOLN
INTRAMUSCULAR | Status: AC
  Filled 2017-10-14: qty 5

## 2017-10-14 MED ORDER — SULFAMETHOXAZOLE-TRIMETHOPRIM 800-160 MG PO TABS: 1.0000 | ORAL_TABLET | Freq: Two times a day (BID) | ORAL | 0 refills | Status: DC

## 2017-10-14 MED ORDER — LIDOCAINE HCL (PF) 1 % IJ SOLN
5.0000 mL | Freq: Once | INTRAMUSCULAR | Status: AC
  Administered 2017-10-14: 5 mL

## 2017-10-14 NOTE — ED Triage Notes (Signed)
Here for abscess to right flank area. Present X 4-5 days. Has been bleeding some.  No fevers. Abscess noted with hole from where has been draining.  Afebrile in triage.

## 2017-10-14 NOTE — ED Notes (Signed)
See triage note  Presents with a possible abscess area to right lower back  States he noted area about 5 days ago  It began to drain 2-3 days ago

## 2017-10-14 NOTE — ED Provider Notes (Signed)
[facility] Emergency Department Provider Note   ____________________________________________   First MD Initiated Contact with Patient 10/14/17 1004     (approximate)  I have reviewed the triage vital signs and the nursing notes.   HISTORY  Chief Complaint Abscess    HPI Keith Jacobson is a 22 y.o. male patient complaint abscess to the right flank area for 4-5 days.  Patient states mild bleeding and drainage from the area.  Patient denies fever associated with this complaint.  Patient rates pain as a 10/10.  Patient described the pain as "aching".  No palates measured for complaint.  Past Medical History:  Diagnosis Date  . Medical history non-contributory     There are no active problems to display for this patient.   Past Surgical History:  Procedure Laterality Date  . NO PAST SURGERIES    . ORIF ANKLE FRACTURE Left 05/27/2015   Procedure: OPEN REDUCTION INTERNAL FIXATION (ORIF) ANKLE FRACTURE;  Surgeon: Gwyneth RevelsJustin Fowler, DPM;  Location: [facility] ORS;  Service: Podiatry;  Laterality: Left;    Prior to Admission medications   Medication Sig Start Date End Date Taking? Authorizing Provider  ibuprofen (ADVIL,MOTRIN) 800 MG tablet Take 1 tablet (800 mg total) by mouth every 8 (eight) hours as needed for moderate pain. 10/14/17   Joni ReiningSmith, Caeleigh [place] K, PA-C  meloxicam (MOBIC) 15 MG tablet Take 1 tablet (15 mg total) by mouth daily. 05/23/16   Cuthriell, Delorise RoyalsJonathan D, PA-C  oxyCODONE-acetaminophen (PERCOCET) 7.5-325 MG tablet Take 1 tablet by mouth every 6 (six) hours as needed for severe pain. 10/14/17   Joni ReiningSmith, Emry Tobin K, PA-C  oxyCODONE-acetaminophen (PERCOCET/ROXICET) 5-325 MG per tablet Take 1 tablet by mouth every 4 (four) hours as needed for severe pain. 05/26/15   Darci CurrentBrown, [place] N, MD  sulfamethoxazole-trimethoprim (BACTRIM DS,SEPTRA DS) 800-160 MG tablet Take 1 tablet by mouth 2 (two) times daily. 10/14/17   Joni ReiningSmith, Emira Eubanks K, PA-C    Allergies Patient has no  known allergies.  History reviewed. No pertinent family history.  Social History Social History   Tobacco Use  . Smoking status: Never Smoker  . Smokeless tobacco: Never Used  Substance Use Topics  . Alcohol use: No  . Drug use: No    Review of Systems Constitutional: No fever/chills Eyes: No visual changes. ENT: No sore throat. Cardiovascular: Denies chest pain. Respiratory: Denies shortness of breath. Gastrointestinal: No abdominal pain.  No nausea, no vomiting.  No diarrhea.  No constipation. Genitourinary: Negative for dysuria. Musculoskeletal: Negative for back pain. Skin: Redness and swelling to the right flank area Neurological: Negative for headaches, focal weakness or numbness.   ____________________________________________   PHYSICAL EXAM:  VITAL SIGNS: ED Triage Vitals  Enc Vitals Group     BP 10/14/17 0948 (!) 136/92     Pulse Rate 10/14/17 0948 69     Resp 10/14/17 0948 16     Temp 10/14/17 0948 98.9 F (37.2 C)     Temp Source 10/14/17 0948 Oral     SpO2 10/14/17 0948 99 %     Weight 10/14/17 0943 250 lb (113.4 kg)     Height 10/14/17 0943 6' (1.829 m)     Head Circumference --      Peak Flow --      Pain Score 10/14/17 0943 10     Pain Loc --      Pain Edu? --      Excl. in GC? --    Constitutional: Alert and oriented. Well appearing  and in no acute distress.  No cervical lymphadenopathy. Cardiovascular: Normal rate, regular rhythm. Grossly normal heart sounds.  Good peripheral circulation. Respiratory: Normal respiratory effort.  No retractions. Lungs CTAB. Gastrointestinal: Soft and nontender. No distention. No abdominal bruits. No CVA tenderness. Skin:  Skin is warm, dry and intact.  Edema and erythema right flank area psychiatric: Mood and affect are normal. Speech and behavior are normal.  ____________________________________________   LABS (all labs ordered are listed, but only abnormal results are displayed)  Labs Reviewed - No  data to display ____________________________________________  EKG   ____________________________________________  RADIOLOGY  No results found.  ____________________________________________   PROCEDURES  Procedure(s) performed: None  .Marland KitchenIncision and Drainage Date/Time: 10/14/2017 10:38 AM Performed by: Joni Reining, PA-C Authorized by: Joni Reining, PA-C   Consent:    Consent obtained:  Verbal   Consent given by:  Patient   Risks discussed:  Incomplete drainage, pain, bleeding and infection Location:    Type:  Abscess   Location:  Trunk   Trunk location:  Back Pre-procedure details:    Skin preparation:  Betadine Anesthesia (see MAR for exact dosages):    Anesthesia method:  Local infiltration   Local anesthetic:  Lidocaine 1% w/o epi Procedure type:    Complexity:  Complex Procedure details:    Incision types:  Single with marsupialization   Incision depth:  Dermal   Scalpel blade:  11   Wound management:  Probed and deloculated   Drainage:  Purulent and serosanguinous   Drainage amount:  Moderate   Wound treatment:  Drain placed   Packing materials:  1/4 in iodoform gauze Post-procedure details:    Patient tolerance of procedure:  Tolerated well, no immediate complications    Critical Care performed: No  ____________________________________________   INITIAL IMPRESSION / ASSESSMENT AND PLAN / ED COURSE  As part of my medical decision making, I reviewed the following data within the electronic MEDICAL RECORD NUMBER     Abscess left flank area.  Area was incised and drained.  Patient given discharge care instructions and a work note.  Patient return back in 2 days for reevaluation.  Take medication as directed.      ____________________________________________   FINAL CLINICAL IMPRESSION(S) / ED DIAGNOSES  Final diagnoses:  Abscess     ED Discharge Orders        Ordered    sulfamethoxazole-trimethoprim (BACTRIM DS,SEPTRA DS) 800-160 MG  tablet  2 times daily     10/14/17 1036    oxyCODONE-acetaminophen (PERCOCET) 7.5-325 MG tablet  Every 6 hours PRN     10/14/17 1036    ibuprofen (ADVIL,MOTRIN) 800 MG tablet  Every 8 hours PRN     10/14/17 1036       Note:  This document was prepared using Dragon voice recognition software and may include unintentional dictation errors.    Joni Reining, PA-C 10/14/17 1041    Loleta Rose, MD 10/14/17 1320

## 2017-10-16 ENCOUNTER — Other Ambulatory Visit: Payer: Self-pay

## 2017-10-16 ENCOUNTER — Emergency Department
Admission: EM | Admit: 2017-10-16 | Discharge: 2017-10-16 | Disposition: A | Discharge status: Home / Self Care | Payer: Self-pay | Attending: Student in an Organized Health Care Education/Training Program | Admitting: Student in an Organized Health Care Education/Training Program

## 2017-10-16 ENCOUNTER — Encounter: Payer: Self-pay | Admitting: Emergency Medicine

## 2017-10-16 DIAGNOSIS — Z79899 Other long term (current) drug therapy: Secondary | ICD-10-CM | POA: Insufficient documentation

## 2017-10-16 DIAGNOSIS — Z5189 Encounter for other specified aftercare: Secondary | ICD-10-CM | POA: Insufficient documentation

## 2017-10-16 DIAGNOSIS — L0231 Cutaneous abscess of buttock: Secondary | ICD-10-CM | POA: Insufficient documentation

## 2017-10-16 MED ORDER — FENTANYL CITRATE (PF) 100 MCG/2ML IJ SOLN
INTRAMUSCULAR | Status: AC
  Filled 2017-10-16: qty 2

## 2017-10-16 NOTE — ED Provider Notes (Signed)
[facility] Emergency Department Provider Note  ____________________________________________  Time seen: Approximately 7:19 PM  I have reviewed the triage vital signs and the nursing notes.   HISTORY  Chief Complaint Wound Check    HPI Keith Jacobson is a 22 y.o. male who presents the emergency department for wound recheck.  Patient seen 2 days ago for incision and drainage of an abscess to the right upper buttocks..  Patient reports that he changed his dressing this morning and packing fell out.  Area still oozing pus.  Patient reports that pain, redness is drastically improved.  Patient is taking his antibiotics as prescribed.  Patient has no other complaints at this time.  Past Medical History:  Diagnosis Date  . Medical history non-contributory     There are no active problems to display for this patient.   Past Surgical History:  Procedure Laterality Date  . NO PAST SURGERIES    . ORIF ANKLE FRACTURE Left 05/27/2015   Procedure: OPEN REDUCTION INTERNAL FIXATION (ORIF) ANKLE FRACTURE;  Surgeon: Gwyneth Revels, DPM;  Location: [facility] ORS;  Service: Podiatry;  Laterality: Left;    Prior to Admission medications   Medication Sig Start Date End Date Taking? Authorizing Provider  ibuprofen (ADVIL,MOTRIN) 800 MG tablet Take 1 tablet (800 mg total) by mouth every 8 (eight) hours as needed for moderate pain. 10/14/17   Joni Reining, PA-C  meloxicam (MOBIC) 15 MG tablet Take 1 tablet (15 mg total) by mouth daily. 05/23/16   Cuthriell, Delorise Royals, PA-C  oxyCODONE-acetaminophen (PERCOCET) 7.5-325 MG tablet Take 1 tablet by mouth every 6 (six) hours as needed for severe pain. 10/14/17   Joni Reining, PA-C  oxyCODONE-acetaminophen (PERCOCET/ROXICET) 5-325 MG per tablet Take 1 tablet by mouth every 4 (four) hours as needed for severe pain. 05/26/15   Darci Current, MD  sulfamethoxazole-trimethoprim (BACTRIM DS,SEPTRA DS) 800-160 MG tablet Take 1 tablet by mouth  2 (two) times daily. 10/14/17   Joni Reining, PA-C    Allergies Patient has no known allergies.  History reviewed. No pertinent family history.  Social History Social History   Tobacco Use  . Smoking status: Never Smoker  . Smokeless tobacco: Never Used  Substance Use Topics  . Alcohol use: No  . Drug use: No     Review of Systems  Constitutional: No fever/chills Eyes: No visual changes. Cardiovascular: no chest pain. Respiratory: no cough. No SOB. Gastrointestinal: No abdominal pain.  No nausea, no vomiting.   Musculoskeletal: Negative for musculoskeletal pain. Skin: Patient presents emergency department for wound recheck of abscess that was incised and drained 2 days ago.  Abscesses to the right upper buttocks region. Neurological: Negative for headaches, focal weakness or numbness. 10-point ROS otherwise negative.  ____________________________________________   PHYSICAL EXAM:  VITAL SIGNS: ED Triage Vitals  Enc Vitals Group     BP 10/16/17 1813 130/62     Pulse Rate 10/16/17 1813 69     Resp 10/16/17 1813 20     Temp 10/16/17 1813 98.2 F (36.8 C)     Temp Source 10/16/17 1813 Oral     SpO2 10/16/17 1813 99 %     Weight 10/16/17 1814 244 lb (110.7 kg)     Height 10/16/17 1814 6' (1.829 m)     Head Circumference --      Peak Flow --      Pain Score 10/16/17 1822 7     Pain Loc --  Pain Edu? --      Excl. in GC? --      Constitutional: Alert and oriented. Well appearing and in no acute distress. Eyes: Conjunctivae are normal. PERRL. EOMI. Head: Atraumatic. Neck: No stridor.    Cardiovascular: Normal rate, regular rhythm. Normal S1 and S2.  Good peripheral circulation. Respiratory: Normal respiratory effort without tachypnea or retractions. Lungs CTAB. Good air entry to the bases with no decreased or absent breath sounds. Musculoskeletal: Full range of motion to all extremities. No gross deformities appreciated. Neurologic:  Normal speech and  language. No gross focal neurologic deficits are appreciated.  Skin:  Skin is warm, dry and intact. No rash noted.  Abscess to the right upper buttocks is appreciated.  This area is open consistent with previous incision and drainage.  There is some mild pus in the wound.  According to previous notes and my exam, surrounding erythema and edema has significantly improved.  At this time, wound is shallow enough that it does not require repeat packing.  Area is still mildly tender to palpation. Psychiatric: Mood and affect are normal. Speech and behavior are normal. Patient exhibits appropriate insight and judgement.   ____________________________________________   LABS (all labs ordered are listed, but only abnormal results are displayed)  Labs Reviewed - No data to display ____________________________________________  EKG   ____________________________________________  RADIOLOGY   No results found.  ____________________________________________    PROCEDURES  Procedure(s) performed:    Procedures     Medications  fentaNYL (SUBLIMAZE) 100 MCG/2ML injection (not administered)     ____________________________________________   INITIAL IMPRESSION / ASSESSMENT AND PLAN / ED COURSE  Pertinent labs & imaging results that were available during my care of the patient were reviewed by me and considered in my medical decision making (see chart for details).  Review of the [place] CSRS was performed in accordance of the [facility] prior to dispensing any controlled drugs.     Patient's diagnosis is consistent with encounter for wound recheck of abscess.  This appears to be healing well compared to previous notes.  Patient is reporting symptomatic improvement.  Area is healing, dressing is reapplied.  Patient reports packing fell out prior to arrival, examination reveals no significant cavity requiring repeat packing.  She is to continue antibiotics at home.Marland Kitchen.  She will follow-up with  primary care as needed.  He will return to the emergency department should this area worsen or not be fully healed at the finish of antibiotics.  Patient is given ED precautions to return to the ED for any worsening or new symptoms.     ____________________________________________  FINAL CLINICAL IMPRESSION(S) / ED DIAGNOSES  Final diagnoses:  Wound check, abscess      NEW MEDICATIONS STARTED DURING THIS VISIT:  ED Discharge Orders    None          This chart was dictated using voice recognition software/Dragon. Despite best efforts to proofread, errors can occur which can change the meaning. Any change was purely unintentional.    Racheal PatchesCuthriell, Jonathan D, PA-C 10/16/17 2002    Willy Eddyobinson, Patrick, MD 10/16/17 2005

## 2017-10-16 NOTE — ED Triage Notes (Addendum)
Here for wound recheck. Was drained and packed 2 days ago. Packing came out today when removed dressing.  Pt reports feeling better. Is taking abx.  Girlfriend reports has been draining but explained that is what we want to happen and why packing was placed so that infection can drain out. Appears improved from when seen by this RN Monday.

## 2017-10-16 NOTE — ED Notes (Signed)
Pt [facility] on stretcher with no distress noted; seen here 2 days ago for I&D for abscess to right lower back; packing has fallen out; cont to take antibiotics as rx and st feeling much better; st here for recheck of wound as instructed

## 2017-12-17 ENCOUNTER — Encounter: Payer: Self-pay | Admitting: Medical Oncology

## 2017-12-17 ENCOUNTER — Emergency Department
Admission: EM | Admit: 2017-12-17 | Discharge: 2017-12-17 | Disposition: A | Discharge status: Home / Self Care | Payer: Self-pay | Attending: Emergency Medicine | Admitting: Emergency Medicine

## 2017-12-17 DIAGNOSIS — L0100 Impetigo, unspecified: Secondary | ICD-10-CM

## 2017-12-17 DIAGNOSIS — K122 Cellulitis and abscess of mouth: Secondary | ICD-10-CM | POA: Insufficient documentation

## 2017-12-17 DIAGNOSIS — L0291 Cutaneous abscess, unspecified: Secondary | ICD-10-CM

## 2017-12-17 MED ORDER — SULFAMETHOXAZOLE-TRIMETHOPRIM 800-160 MG PO TABS: 1.0000 | ORAL_TABLET | Freq: Two times a day (BID) | ORAL | 0 refills | Status: DC

## 2017-12-17 MED ORDER — NAPROXEN 500 MG PO TABS: 500.0000 mg | ORAL_TABLET | Freq: Two times a day (BID) | ORAL | 0 refills | Status: AC

## 2017-12-17 NOTE — ED Provider Notes (Signed)
[facility] Emergency Department Provider Note   ____________________________________________   First MD Initiated Contact with Patient 12/17/17 1441     (approximate)  I have reviewed the triage vital signs and the nursing notes.   HISTORY  Chief Complaint Abscess    HPI Keith Jacobson is a 22 y.o. male complaining of abscess to the right side of his mouth for greenish drainage for 5 days.  Patient state has a history of staph infections.   Past Medical History:  Diagnosis Date  . Medical history non-contributory     There are no active problems to display for this patient.   Past Surgical History:  Procedure Laterality Date  . NO PAST SURGERIES    . ORIF ANKLE FRACTURE Left 05/27/2015   Procedure: OPEN REDUCTION INTERNAL FIXATION (ORIF) ANKLE FRACTURE;  Surgeon: Gwyneth Revels, DPM;  Location: [facility] ORS;  Service: Podiatry;  Laterality: Left;    Prior to Admission medications   Medication Sig Start Date End Date Taking? Authorizing Provider  ibuprofen (ADVIL,MOTRIN) 800 MG tablet Take 1 tablet (800 mg total) by mouth every 8 (eight) hours as needed for moderate pain. 10/14/17   Joni Reining, PA-C  meloxicam (MOBIC) 15 MG tablet Take 1 tablet (15 mg total) by mouth daily. 05/23/16   Cuthriell, Delorise Royals, PA-C  naproxen (NAPROSYN) 500 MG tablet Take 1 tablet (500 mg total) by mouth 2 (two) times daily with a meal. 12/17/17   Joni Reining, PA-C  oxyCODONE-acetaminophen (PERCOCET) 7.5-325 MG tablet Take 1 tablet by mouth every 6 (six) hours as needed for severe pain. 10/14/17   Joni Reining, PA-C  oxyCODONE-acetaminophen (PERCOCET/ROXICET) 5-325 MG per tablet Take 1 tablet by mouth every 4 (four) hours as needed for severe pain. 05/26/15   Darci Current, MD  sulfamethoxazole-trimethoprim (BACTRIM DS,SEPTRA DS) 800-160 MG tablet Take 1 tablet by mouth 2 (two) times daily. 10/14/17   Joni Reining, PA-C  sulfamethoxazole-trimethoprim (BACTRIM  DS,SEPTRA DS) 800-160 MG tablet Take 1 tablet by mouth 2 (two) times daily. 12/17/17   Joni Reining, PA-C    Allergies Patient has no known allergies.  No family history on file.  Social History Social History   Tobacco Use  . Smoking status: Never Smoker  . Smokeless tobacco: Never Used  Substance Use Topics  . Alcohol use: No  . Drug use: No    Review of Systems Constitutional: No fever/chills Eyes: No visual changes. ENT: No sore throat. Cardiovascular: Denies chest pain. Respiratory: Denies shortness of breath. Gastrointestinal: No abdominal pain.  No nausea, no vomiting.  No diarrhea.  No constipation. Genitourinary: Negative for dysuria. Musculoskeletal: Negative for back pain. Skin: Negative for rash. Neurological: Negative for headaches, focal weakness or numbness.   ____________________________________________   PHYSICAL EXAM:  VITAL SIGNS: ED Triage Vitals  Enc Vitals Group     BP 12/17/17 1329 (!) 152/84     Pulse Rate 12/17/17 1329 77     Resp 12/17/17 1329 16     Temp 12/17/17 1329 98.2 F (36.8 C)     Temp Source 12/17/17 1329 Oral     SpO2 12/17/17 1329 97 %     Weight 12/17/17 1330 235 lb (106.6 kg)     Height 12/17/17 1330 6' (1.829 m)     Head Circumference --      Peak Flow --      Pain Score 12/17/17 1330 9     Pain Loc --  Pain Edu? --      Excl. in GC? --    Constitutional: Alert and oriented. Well appearing and in no acute distress. Eyes: Conjunctivae are normal. PERRL. EOMI. Mouth/Throat: Mucous membranes are moist.  Oropharynx non-erythematous. Neck: No stridor. Hematological/Lymphatic/Immunilogical: No cervical lymphadenopathy. Cardiovascular: Normal rate, regular rhythm. Grossly normal heart sounds.  Good peripheral circulation.  Elevated blood pressure Respiratory: Normal respiratory effort.  No retractions. Lungs CTAB. Skin:  Skin is warm, dry and intact. No rash noted.  Erythematous maculopapular lesions facial and upper  extremities. Psychiatric: Mood and affect are normal. Speech and behavior are normal.  ____________________________________________   LABS (all labs ordered are listed, but only abnormal results are displayed)  Labs Reviewed - No data to display ____________________________________________  EKG   ____________________________________________  RADIOLOGY  ED MD interpretation:    Official radiology report(s): No results found.  ____________________________________________   PROCEDURES  Procedure(s) performed: None  Procedures  Critical Care performed: No  ____________________________________________   INITIAL IMPRESSION / ASSESSMENT AND PLAN / ED COURSE  As part of my medical decision making, I reviewed the following data within the electronic MEDICAL RECORD NUMBER    Bacterial skin infection secondary to impetigo.  Patient given discharge care instruction.  Patient given prescription for Bactrim DS naproxen.  Patient given consult to dermatology secondary to chronic flareups.      ____________________________________________   FINAL CLINICAL IMPRESSION(S) / ED DIAGNOSES  Final diagnoses:  Abscess  Impetigo     ED Discharge Orders        Ordered    sulfamethoxazole-trimethoprim (BACTRIM DS,SEPTRA DS) 800-160 MG tablet  2 times daily     12/17/17 1447    naproxen (NAPROSYN) 500 MG tablet  2 times daily with meals     12/17/17 1447       Note:  This document was prepared using Dragon voice recognition software and may include unintentional dictation errors.    Joni ReiningSmith, Ronald K, PA-C 12/17/17 1455    Jene EveryKinner, Robert, MD 12/17/17 [phone]

## 2017-12-17 NOTE — Discharge Instructions (Signed)
Recommend antibacterial soap until evaluation by dermatologist.

## 2017-12-17 NOTE — ED Notes (Signed)
See triage note  Presents with redness and swelling to right side of face near lip   States that started about 5 days ago    Then noticed redness and swelling under chin 2 days ago  Afebrile on arrival

## 2017-12-17 NOTE — ED Triage Notes (Signed)
Pt reports 5 days he has had an abscess to the side of his mouth that has been draining.

## 2018-07-27 ENCOUNTER — Encounter: Payer: Self-pay | Admitting: *Deleted

## 2018-07-27 ENCOUNTER — Other Ambulatory Visit: Payer: Self-pay

## 2018-07-27 DIAGNOSIS — R509 Fever, unspecified: Secondary | ICD-10-CM | POA: Insufficient documentation

## 2018-07-27 DIAGNOSIS — L03114 Cellulitis of left upper limb: Secondary | ICD-10-CM | POA: Insufficient documentation

## 2018-07-27 DIAGNOSIS — Z79899 Other long term (current) drug therapy: Secondary | ICD-10-CM | POA: Insufficient documentation

## 2018-07-27 LAB — CBC WITH DIFFERENTIAL/PLATELET
ABS IMMATURE GRANULOCYTES: 0.03 10*3/uL (ref 0.00–0.07)
BASOS ABS: 0 10*3/uL (ref 0.0–0.1)
BASOS PCT: 1 %
EOS ABS: 0.2 10*3/uL (ref 0.0–0.5)
Eosinophils Relative: 2 %
HCT: 42.8 % (ref 39.0–52.0)
Hemoglobin: 14.6 g/dL (ref 13.0–17.0)
IMMATURE GRANULOCYTES: 0 %
Lymphocytes Relative: 26 %
Lymphs Abs: 2 10*3/uL (ref 0.7–4.0)
MCH: 29.5 pg (ref 26.0–34.0)
MCHC: 34.1 g/dL (ref 30.0–36.0)
MCV: 86.5 fL (ref 80.0–100.0)
Monocytes Absolute: 0.6 10*3/uL (ref 0.1–1.0)
Monocytes Relative: 8 %
NEUTROS ABS: 4.8 10*3/uL (ref 1.7–7.7)
NEUTROS PCT: 63 %
NRBC: 0 % (ref 0.0–0.2)
PLATELETS: 281 10*3/uL (ref 150–400)
RBC: 4.95 MIL/uL (ref 4.22–5.81)
RDW: 12.1 % (ref 11.5–15.5)
WBC: 7.6 10*3/uL (ref 4.0–10.5)

## 2018-07-27 LAB — COMPREHENSIVE METABOLIC PANEL
ALT: 50 U/L — ABNORMAL HIGH (ref 0–44)
ANION GAP: 10 (ref 5–15)
AST: 35 U/L (ref 15–41)
Albumin: 4.3 g/dL (ref 3.5–5.0)
Alkaline Phosphatase: 71 U/L (ref 38–126)
BILIRUBIN TOTAL: 0.6 mg/dL (ref 0.3–1.2)
BUN: 15 mg/dL (ref 6–20)
CHLORIDE: 101 mmol/L (ref 98–111)
CO2: 28 mmol/L (ref 22–32)
Calcium: 9.4 mg/dL (ref 8.9–10.3)
Creatinine, Ser: 0.96 mg/dL (ref 0.61–1.24)
Glucose, Bld: 113 mg/dL — ABNORMAL HIGH (ref 70–99)
POTASSIUM: 3.7 mmol/L (ref 3.5–5.1)
Sodium: 139 mmol/L (ref 135–145)
TOTAL PROTEIN: 7.7 g/dL (ref 6.5–8.1)

## 2018-07-27 NOTE — ED Notes (Signed)
Pt presents from work, open wound area and swelling to left elbow;

## 2018-07-27 NOTE — ED Triage Notes (Signed)
Pt to ED with an abscess to his left arm. Warmth and redness noted to left elbow and forearm with pain and tenderness upon palpation. Pt verbalized concern that a spider bit him but did not see anything in particular bite him. Pt reporting fevers at home of 100.6. Pt took tylenol at 18:30 and is currently afebrile. No drainage noted at this time.

## 2018-07-28 ENCOUNTER — Emergency Department
Admission: EM | Admit: 2018-07-28 | Discharge: 2018-07-28 | Disposition: A | Discharge status: Home / Self Care | Payer: Self-pay | Attending: Emergency Medicine | Admitting: Emergency Medicine

## 2018-07-28 DIAGNOSIS — L03114 Cellulitis of left upper limb: Secondary | ICD-10-CM

## 2018-07-28 LAB — LACTIC ACID, PLASMA: Lactic Acid, Venous: 1.5 mmol/L (ref 0.5–1.9)

## 2018-07-28 MED ORDER — CEPHALEXIN 500 MG PO CAPS
500.0000 mg | ORAL_CAPSULE | Freq: Once | ORAL | Status: AC
  Administered 2018-07-28: 500 mg via ORAL
  Filled 2018-07-28: qty 1

## 2018-07-28 MED ORDER — SULFAMETHOXAZOLE-TRIMETHOPRIM 800-160 MG PO TABS
1.0000 | ORAL_TABLET | Freq: Once | ORAL | Status: AC
  Administered 2018-07-28: 1 via ORAL
  Filled 2018-07-28: qty 1

## 2018-07-28 MED ORDER — SULFAMETHOXAZOLE-TRIMETHOPRIM 800-160 MG PO TABS: 1.0000 | ORAL_TABLET | Freq: Two times a day (BID) | ORAL | 0 refills | Status: AC

## 2018-07-28 MED ORDER — CEPHALEXIN 500 MG PO CAPS: 500.0000 mg | ORAL_CAPSULE | Freq: Three times a day (TID) | ORAL | 0 refills | Status: DC

## 2018-07-28 NOTE — ED Provider Notes (Signed)
[facility] Emergency Department Provider Note  ____________________________________________  Time seen: Approximately 12:44 AM  I have reviewed the triage vital signs and the nursing notes.   HISTORY  Chief Complaint Abscess    HPI Keith Jacobson is a 22 y.o. male with no significant past medical history who complains of left arm pain and swelling for the past 2 days.  Had a fever to 101 earlier today but none since about 6:00 PM.  Denies fever chills or trouble eating and drinking.  No abdominal pain seizures changes.   No outdoor work or [employer] work, seen any spiders but does think that he probably may have come in contact with spiders.  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     Past Medical History:  Diagnosis Date  . Medical history non-contributory      There are no active problems to display for this patient.    Past Surgical History:  Procedure Laterality Date  . NO PAST SURGERIES    . ORIF ANKLE FRACTURE Left 05/27/2015   Procedure: OPEN REDUCTION INTERNAL FIXATION (ORIF) ANKLE FRACTURE;  Surgeon: Gwyneth Revels, DPM;  Location: [facility] ORS;  Service: Podiatry;  Laterality: Left;     Prior to Admission medications   Medication Sig Start Date End Date Taking? Authorizing Provider  cephALEXin (KEFLEX) 500 MG capsule Take 1 capsule (500 mg total) by mouth 3 (three) times daily. 07/28/18   Sharman Cheek, MD  ibuprofen (ADVIL,MOTRIN) 800 MG tablet Take 1 tablet (800 mg total) by mouth every 8 (eight) hours as needed for moderate pain. 10/14/17   Joni Reining, PA-C  meloxicam (MOBIC) 15 MG tablet Take 1 tablet (15 mg total) by mouth daily. 05/23/16   Cuthriell, Delorise Royals, PA-C  naproxen (NAPROSYN) 500 MG tablet Take 1 tablet (500 mg total) by mouth 2 (two) times daily with a meal. 12/17/17   Joni Reining, PA-C  oxyCODONE-acetaminophen (PERCOCET) 7.5-325 MG tablet Take 1 tablet by mouth every 6 (six) hours as  needed for severe pain. 10/14/17   Joni Reining, PA-C  oxyCODONE-acetaminophen (PERCOCET/ROXICET) 5-325 MG per tablet Take 1 tablet by mouth every 4 (four) hours as needed for severe pain. 05/26/15   Darci Current, MD  sulfamethoxazole-trimethoprim (BACTRIM DS) 800-160 MG tablet Take 1 tablet by mouth 2 (two) times daily. 07/28/18   Sharman Cheek, MD     Allergies Patient has no known allergies.   History reviewed. No pertinent family history.  Social History Social History   Tobacco Use  . Smoking status: Never Smoker  . Smokeless tobacco: Never Used  Substance Use Topics  . Alcohol use: No  . Drug use: No    Review of Systems  Constitutional:   Positive fever without chills.  ENT:   No sore throat. No rhinorrhea. Cardiovascular:   No chest pain or syncope. Respiratory:   No dyspnea or cough. Gastrointestinal:   Negative for abdominal pain, vomiting and diarrhea.  Musculoskeletal:   Positive left forearm pain and swelling All other systems reviewed and are negative except as documented above in ROS and HPI.  ____________________________________________   PHYSICAL EXAM:  VITAL SIGNS: ED Triage Vitals [07/27/18 2258]  Enc Vitals Group     BP 134/66     Pulse Rate 72     Resp 16     Temp 98.1 F (36.7 C)     Temp Source Oral     SpO2 98 %  Weight 250 lb (113.4 kg)     Height 6' (1.829 m)     Head Circumference      Peak Flow      Pain Score 9     Pain Loc      Pain Edu?      Excl. in GC?     Vital signs reviewed, nursing assessments reviewed.   Constitutional:   Alert and oriented. Non-toxic appearance. Eyes:   Conjunctivae are normal. EOMI. PERRL. ENT      Head:   Normocephalic and atraumatic.      Nose:   No congestion/rhinnorhea.       Mouth/Throat:   MMM, no pharyngeal erythema. No peritonsillar mass.       Neck:   No meningismus. Full ROM. Hematological/Lymphatic/Immunilogical:   No cervical lymphadenopathy. Cardiovascular:   RRR.  Symmetric bilateral radial and DP pulses.  No murmurs. Cap refill less than 2 seconds. Respiratory:   Normal respiratory effort without tachypnea/retractions. Breath sounds are clear and equal bilaterally. No wheezes/rales/rhonchi. Gastrointestinal:   Soft and nontender. Non distended. There is no CVA tenderness.  No rebound, rigidity, or guarding. Genitourinary:   deferred Musculoskeletal:   Normal range of motion in all extremities. No joint effusions.  No lower extremity tenderness.  No edema.  Left elbow nontender, no joint effusion.  Left proximal dorsal forearm has a 1 cm superficial ulceration with necrotic center with surrounding erythema warmth tenderness and skin induration.  No fluctuance.  Bedside point-of-care ultrasound utilized, shows cobblestoning of the soft tissue without discernible fluid collection, no drainable abscess. Neurologic:   Normal speech and language.  Motor grossly intact. No acute focal neurologic deficits are appreciated.  Skin:    Skin is warm, dry with area of cellulitis as above on the left forearm, otherwise intact. ____________________________________________    LABS (pertinent positives/negatives) (all labs ordered are listed, but only abnormal results are displayed) Labs Reviewed  COMPREHENSIVE METABOLIC PANEL - Abnormal; Notable for the following components:      Result Value   Glucose, Bld 113 (*)    ALT 50 (*)    All other components within normal limits  LACTIC ACID, PLASMA  CBC WITH DIFFERENTIAL/PLATELET  LACTIC ACID, PLASMA   ____________________________________________   EKG    ____________________________________________    RADIOLOGY  No results found.  ____________________________________________   PROCEDURES Procedures  ____________________________________________    CLINICAL IMPRESSION / ASSESSMENT AND PLAN / ED COURSE  Pertinent labs & imaging results that were available during my care of the patient were reviewed  by me and considered in my medical decision making (see chart for details).    Patient presents with cellulitis of the left forearm.  well contained, no lymphangitis or abscess.  Doubt necrotizing fasciitis.  Suitable for discharge home on Keflex and Bactrim for strep and staph/MRSA coverage.  Nontoxic.  Not septic.      ____________________________________________   FINAL CLINICAL IMPRESSION(S) / ED DIAGNOSES    Final diagnoses:  Cellulitis of left upper extremity     ED Discharge Orders         Ordered    cephALEXin (KEFLEX) 500 MG capsule  3 times daily     07/28/18 0044    sulfamethoxazole-trimethoprim (BACTRIM DS) 800-160 MG tablet  2 times daily     07/28/18 0044          Portions of this note were generated with dragon dictation software. Dictation errors may occur despite best attempts at proofreading.  Sharman Cheek, MD 07/28/18 [phone]

## 2018-07-28 NOTE — ED Notes (Signed)
PT reports he thinks he may have been bitten by a spider but he isn't sure. His left arm is swollen and red. Pt states it started two days ago. He is dizzy, has a fever, and states dizziness. Pt is alert and oriented x 4.

## 2018-09-22 ENCOUNTER — Encounter: Payer: Self-pay | Admitting: Emergency Medicine

## 2018-09-22 ENCOUNTER — Emergency Department
Admission: EM | Admit: 2018-09-22 | Discharge: 2018-09-22 | Disposition: A | Discharge status: Home / Self Care | Payer: Self-pay | Attending: Emergency Medicine | Admitting: Emergency Medicine

## 2018-09-22 ENCOUNTER — Other Ambulatory Visit: Payer: Self-pay

## 2018-09-22 DIAGNOSIS — L03314 Cellulitis of groin: Secondary | ICD-10-CM | POA: Insufficient documentation

## 2018-09-22 MED ORDER — IBUPROFEN 600 MG PO TABS: 600.0000 mg | ORAL_TABLET | Freq: Three times a day (TID) | ORAL | 0 refills | Status: AC | PRN

## 2018-09-22 MED ORDER — SULFAMETHOXAZOLE-TRIMETHOPRIM 800-160 MG PO TABS
1.0000 | ORAL_TABLET | Freq: Once | ORAL | Status: AC
  Administered 2018-09-22: 1 via ORAL
  Filled 2018-09-22: qty 1

## 2018-09-22 MED ORDER — OXYCODONE-ACETAMINOPHEN 7.5-325 MG PO TABS: 1.0000 | ORAL_TABLET | Freq: Four times a day (QID) | ORAL | 0 refills | Status: AC | PRN

## 2018-09-22 MED ORDER — NAPROXEN 500 MG PO TABS
500.0000 mg | ORAL_TABLET | Freq: Once | ORAL | Status: AC
  Administered 2018-09-22: 500 mg via ORAL
  Filled 2018-09-22: qty 1

## 2018-09-22 MED ORDER — SULFAMETHOXAZOLE-TRIMETHOPRIM 800-160 MG PO TABS: 1.0000 | ORAL_TABLET | Freq: Two times a day (BID) | ORAL | 0 refills | Status: DC

## 2018-09-22 MED ORDER — OXYCODONE-ACETAMINOPHEN 5-325 MG PO TABS
1.0000 | ORAL_TABLET | Freq: Once | ORAL | Status: AC
  Administered 2018-09-22: 1 via ORAL
  Filled 2018-09-22: qty 1

## 2018-09-22 NOTE — ED Triage Notes (Signed)
Presents with possible abscess area to left groin and buttock area

## 2018-09-22 NOTE — ED Provider Notes (Signed)
[facility] Emergency Department Provider Note   ____________________________________________   First MD Initiated Contact with Patient 09/22/18 1332     (approximate)  I have reviewed the triage vital signs and the nursing notes.   HISTORY  Chief Complaint Abscess    HPI Keith Jacobson is a 23 y.o. male patient presents for multiple erythematous papular lesions on the lower extremity and groin area.  Similar lesions on the patient buttocks which caused difficulty sitting.  Patient rates pain as a 6/10.  Patient describes the pain as "sore".  No palliative measure for complaint.  Past Medical History:  Diagnosis Date  . Medical history non-contributory     There are no active problems to display for this patient.   Past Surgical History:  Procedure Laterality Date  . NO PAST SURGERIES    . ORIF ANKLE FRACTURE Left 05/27/2015   Procedure: OPEN REDUCTION INTERNAL FIXATION (ORIF) ANKLE FRACTURE;  Surgeon: Gwyneth Revels, DPM;  Location: [facility] ORS;  Service: Podiatry;  Laterality: Left;    Prior to Admission medications   Medication Sig Start Date End Date Taking? Authorizing Provider  cephALEXin (KEFLEX) 500 MG capsule Take 1 capsule (500 mg total) by mouth 3 (three) times daily. 07/28/18   Sharman Cheek, MD  ibuprofen (ADVIL,MOTRIN) 600 MG tablet Take 1 tablet (600 mg total) by mouth every 8 (eight) hours as needed. 09/22/18   Joni Reining, PA-C  ibuprofen (ADVIL,MOTRIN) 800 MG tablet Take 1 tablet (800 mg total) by mouth every 8 (eight) hours as needed for moderate pain. 10/14/17   Joni Reining, PA-C  meloxicam (MOBIC) 15 MG tablet Take 1 tablet (15 mg total) by mouth daily. 05/23/16   Cuthriell, Delorise Royals, PA-C  naproxen (NAPROSYN) 500 MG tablet Take 1 tablet (500 mg total) by mouth 2 (two) times daily with a meal. 12/17/17   Joni Reining, PA-C  oxyCODONE-acetaminophen (PERCOCET) 7.5-325 MG tablet Take 1 tablet by mouth every 6 (six) hours  as needed for severe pain. 10/14/17   Joni Reining, PA-C  oxyCODONE-acetaminophen (PERCOCET) 7.5-325 MG tablet Take 1 tablet by mouth every 6 (six) hours as needed. 09/22/18   Joni Reining, PA-C  oxyCODONE-acetaminophen (PERCOCET/ROXICET) 5-325 MG per tablet Take 1 tablet by mouth every 4 (four) hours as needed for severe pain. 05/26/15   Darci Current, MD  sulfamethoxazole-trimethoprim (BACTRIM DS) 800-160 MG tablet Take 1 tablet by mouth 2 (two) times daily. 07/28/18   Sharman Cheek, MD  sulfamethoxazole-trimethoprim (BACTRIM DS,SEPTRA DS) 800-160 MG tablet Take 1 tablet by mouth 2 (two) times daily. 09/22/18   Joni Reining, PA-C    Allergies Patient has no known allergies.  History reviewed. No pertinent family history.  Social History Social History   Tobacco Use  . Smoking status: Never Smoker  . Smokeless tobacco: Never Used  Substance Use Topics  . Alcohol use: No  . Drug use: No    Review of Systems  Constitutional: No fever/chills Eyes: No visual changes. ENT: No sore throat. Cardiovascular: Denies chest pain. Respiratory: Denies shortness of breath. Gastrointestinal: No abdominal pain.  No nausea, no vomiting.  No diarrhea.  No constipation. Genitourinary: Negative for dysuria. Musculoskeletal: Negative for back pain. Skin: Multiple erythematous papular lesions lower extremity and groin area. Neurological: Negative for headaches, focal weakness or numbness.   ____________________________________________   PHYSICAL EXAM:  VITAL SIGNS: ED Triage Vitals  Enc Vitals Group     BP 09/22/18 1216 (!) 151/84  Pulse Rate 09/22/18 1216 79     Resp --      Temp 09/22/18 1216 98.2 F (36.8 C)     Temp Source 09/22/18 1216 Oral     SpO2 09/22/18 1216 97 %     Weight 09/22/18 1217 250 lb (113.4 kg)     Height 09/22/18 1217 6' (1.829 m)     Head Circumference --      Peak Flow --      Pain Score 09/22/18 1217 6     Pain Loc --      Pain Edu? --       Excl. in GC? --     Constitutional: Alert and oriented. Well appearing and in no acute distress. Cardiovascular: Normal rate, regular rhythm. Grossly normal heart sounds.  Good peripheral circulation. Respiratory: Normal respiratory effort.  No retractions. Lungs CTAB. Neurologic:  Normal speech and language. No gross focal neurologic deficits are appreciated. No gait instability. Skin:  Skin is warm, dry and intact.  Multiple erythematous papular lesions lower extremity, buttocks, and groin area.   Psychiatric: Mood and affect are normal. Speech and behavior are normal.  ____________________________________________   LABS (all labs ordered are listed, but only abnormal results are displayed)  Labs Reviewed - No data to display ____________________________________________  EKG   ____________________________________________  RADIOLOGY  ED MD interpretation:    Official radiology report(s): No results found.  ____________________________________________   PROCEDURES  Procedure(s) performed: None  Procedures  Critical Care performed: No  ____________________________________________   INITIAL IMPRESSION / ASSESSMENT AND PLAN / ED COURSE  As part of my medical decision making, I reviewed the following data within the electronic MEDICAL RECORD NUMBER    Multiple small abscesses to the lower extremities and buttocks.  Discussed with patient rationale for the I&D and at this time.  Patient given discharge care instruction.  Patient advised take medication as directed [facility] if condition persist.      ____________________________________________   FINAL CLINICAL IMPRESSION(S) / ED DIAGNOSES  Final diagnoses:  Cellulitis of groin     ED Discharge Orders         Ordered    sulfamethoxazole-trimethoprim (BACTRIM DS,SEPTRA DS) 800-160 MG tablet  2 times daily     09/22/18 1338    oxyCODONE-acetaminophen (PERCOCET) 7.5-325 MG  tablet  Every 6 hours PRN     09/22/18 1338    ibuprofen (ADVIL,MOTRIN) 600 MG tablet  Every 8 hours PRN     09/22/18 1338           Note:  This document was prepared using Dragon voice recognition software and may include unintentional dictation errors.    Joni ReiningSmith,  K, PA-C 09/22/18 1345    Sharman CheekStafford, Phillip, MD 09/29/18 0000

## 2018-09-23 ENCOUNTER — Other Ambulatory Visit: Payer: Self-pay

## 2018-09-23 ENCOUNTER — Encounter: Payer: Self-pay | Admitting: Emergency Medicine

## 2018-09-23 ENCOUNTER — Emergency Department
Admission: EM | Admit: 2018-09-23 | Discharge: 2018-09-23 | Disposition: A | Discharge status: Home / Self Care | Payer: Self-pay | Attending: Emergency Medicine | Admitting: Emergency Medicine

## 2018-09-23 DIAGNOSIS — T783XXA Angioneurotic edema, initial encounter: Secondary | ICD-10-CM | POA: Insufficient documentation

## 2018-09-23 DIAGNOSIS — L509 Urticaria, unspecified: Secondary | ICD-10-CM

## 2018-09-23 DIAGNOSIS — F17290 Nicotine dependence, other tobacco product, uncomplicated: Secondary | ICD-10-CM | POA: Insufficient documentation

## 2018-09-23 DIAGNOSIS — T7840XA Allergy, unspecified, initial encounter: Secondary | ICD-10-CM | POA: Insufficient documentation

## 2018-09-23 DIAGNOSIS — Z79899 Other long term (current) drug therapy: Secondary | ICD-10-CM | POA: Insufficient documentation

## 2018-09-23 MED ORDER — EPINEPHRINE 0.3 MG/0.3ML IJ SOAJ: 0.3000 mg | Freq: Once | INTRAMUSCULAR | 2 refills | Status: AC

## 2018-09-23 MED ORDER — METHYLPREDNISOLONE SODIUM SUCC 125 MG IJ SOLR
INTRAMUSCULAR | Status: AC
  Administered 2018-09-23: 125 mg via INTRAVENOUS
  Filled 2018-09-23: qty 2

## 2018-09-23 MED ORDER — PREDNISONE 20 MG PO TABS: ORAL_TABLET | ORAL | 0 refills | Status: AC

## 2018-09-23 MED ORDER — DIPHENHYDRAMINE HCL 50 MG/ML IJ SOLN: 50.0000 mg | Freq: Once | INTRAMUSCULAR | Status: DC

## 2018-09-23 MED ORDER — FAMOTIDINE 20 MG PO TABS: 20.0000 mg | ORAL_TABLET | Freq: Two times a day (BID) | ORAL | 0 refills | Status: AC

## 2018-09-23 MED ORDER — FAMOTIDINE IN NACL 20-0.9 MG/50ML-% IV SOLN
20.0000 mg | Freq: Once | INTRAVENOUS | Status: AC
  Administered 2018-09-23: 20 mg via INTRAVENOUS

## 2018-09-23 MED ORDER — DIPHENHYDRAMINE HCL 50 MG/ML IJ SOLN
25.0000 mg | Freq: Once | INTRAMUSCULAR | Status: AC
  Administered 2018-09-23: 25 mg via INTRAVENOUS

## 2018-09-23 MED ORDER — ONDANSETRON HCL 4 MG/2ML IJ SOLN
4.0000 mg | Freq: Once | INTRAMUSCULAR | Status: AC
  Administered 2018-09-23: 4 mg via INTRAVENOUS

## 2018-09-23 MED ORDER — CLINDAMYCIN HCL 300 MG PO CAPS: 300.0000 mg | ORAL_CAPSULE | Freq: Three times a day (TID) | ORAL | 0 refills | Status: DC

## 2018-09-23 MED ORDER — DIPHENHYDRAMINE HCL 50 MG/ML IJ SOLN
INTRAMUSCULAR | Status: AC
  Administered 2018-09-23: 25 mg via INTRAVENOUS
  Filled 2018-09-23: qty 1

## 2018-09-23 MED ORDER — EPINEPHRINE 0.3 MG/0.3ML IJ SOAJ
0.3000 mg | Freq: Once | INTRAMUSCULAR | Status: AC
  Administered 2018-09-23: 0.3 mg via INTRAMUSCULAR
  Filled 2018-09-23: qty 0.3

## 2018-09-23 MED ORDER — SODIUM CHLORIDE 0.9 % IV BOLUS
1000.0000 mL | Freq: Once | INTRAVENOUS | Status: AC
  Administered 2018-09-23: 1000 mL via INTRAVENOUS

## 2018-09-23 MED ORDER — ONDANSETRON HCL 4 MG/2ML IJ SOLN
INTRAMUSCULAR | Status: AC
  Administered 2018-09-23: 4 mg via INTRAVENOUS
  Filled 2018-09-23: qty 2

## 2018-09-23 MED ORDER — METHYLPREDNISOLONE SODIUM SUCC 125 MG IJ SOLR
125.0000 mg | Freq: Once | INTRAMUSCULAR | Status: AC
  Administered 2018-09-23: 125 mg via INTRAVENOUS

## 2018-09-23 NOTE — ED Provider Notes (Signed)
[facility] Emergency Department Provider Note   ____________________________________________   First MD Initiated Contact with Patient 09/23/18 0400     (approximate)  I have reviewed the triage vital signs and the nursing notes.   HISTORY  Chief Complaint Allergic Reaction    HPI Keith Jacobson is a 23 y.o. male who presents to the ED from home with a chief complaint of allergic reaction.  Patient was seen in the ED yesterday and prescribed Bactrim for abscess to his left leg.  Thinks he has had a reaction to Bactrim before.  Took Bactrim and went to bed.  Awoke covered with hives with lip swelling.  Denies shortness of breath or difficulty swallowing.  Denies chest pain, abdominal pain, nausea, vomiting or diarrhea.   Past Medical History:  Diagnosis Date  . Medical history non-contributory     There are no active problems to display for this patient.   Past Surgical History:  Procedure Laterality Date  . NO PAST SURGERIES    . ORIF ANKLE FRACTURE Left 05/27/2015   Procedure: OPEN REDUCTION INTERNAL FIXATION (ORIF) ANKLE FRACTURE;  Surgeon: Gwyneth RevelsJustin Fowler, DPM;  Location: [facility] ORS;  Service: Podiatry;  Laterality: Left;    Prior to Admission medications   Medication Sig Start Date End Date Taking? Authorizing Provider  cephALEXin (KEFLEX) 500 MG capsule Take 1 capsule (500 mg total) by mouth 3 (three) times daily. 07/28/18   Sharman CheekStafford, Phillip, MD  ibuprofen (ADVIL,MOTRIN) 600 MG tablet Take 1 tablet (600 mg total) by mouth every 8 (eight) hours as needed. 09/22/18   Joni ReiningSmith, Ronald K, PA-C  ibuprofen (ADVIL,MOTRIN) 800 MG tablet Take 1 tablet (800 mg total) by mouth every 8 (eight) hours as needed for moderate pain. 10/14/17   Joni ReiningSmith, Ronald K, PA-C  meloxicam (MOBIC) 15 MG tablet Take 1 tablet (15 mg total) by mouth daily. 05/23/16   Cuthriell, Delorise RoyalsJonathan D, PA-C  naproxen (NAPROSYN) 500 MG tablet Take 1 tablet (500 mg total) by mouth 2 (two) times  daily with a meal. 12/17/17   Joni ReiningSmith, Ronald K, PA-C  oxyCODONE-acetaminophen (PERCOCET) 7.5-325 MG tablet Take 1 tablet by mouth every 6 (six) hours as needed for severe pain. 10/14/17   Joni ReiningSmith, Ronald K, PA-C  oxyCODONE-acetaminophen (PERCOCET) 7.5-325 MG tablet Take 1 tablet by mouth every 6 (six) hours as needed. 09/22/18   Joni ReiningSmith, Ronald K, PA-C  oxyCODONE-acetaminophen (PERCOCET/ROXICET) 5-325 MG per tablet Take 1 tablet by mouth every 4 (four) hours as needed for severe pain. 05/26/15   Darci CurrentBrown, [place] N, MD  sulfamethoxazole-trimethoprim (BACTRIM DS) 800-160 MG tablet Take 1 tablet by mouth 2 (two) times daily. 07/28/18   Sharman CheekStafford, Phillip, MD  sulfamethoxazole-trimethoprim (BACTRIM DS,SEPTRA DS) 800-160 MG tablet Take 1 tablet by mouth 2 (two) times daily. 09/22/18   Joni ReiningSmith, Ronald K, PA-C    Allergies Bactrim [sulfamethoxazole-trimethoprim]  No family history on file.  Social History Social History   Tobacco Use  . Smoking status: Current Every Day Smoker    Packs/day: 0.00    Types: Cigars  . Smokeless tobacco: Never Used  Substance Use Topics  . Alcohol use: No  . Drug use: No    Review of Systems  Constitutional: No fever/chills Eyes: No visual changes. ENT: Positive for lip swelling.  No sore throat. Cardiovascular: Denies chest pain. Respiratory: Denies shortness of breath. Gastrointestinal: No abdominal pain.  No nausea, no vomiting.  No diarrhea.  No constipation. Genitourinary: Negative for dysuria. Musculoskeletal: Negative for back pain. Skin: Positive  for rash. Neurological: Negative for headaches, focal weakness or numbness.   ____________________________________________   PHYSICAL EXAM:  VITAL SIGNS: ED Triage Vitals  Enc Vitals Group     BP 09/23/18 0353 (!) 147/93     Pulse Rate 09/23/18 0353 (!) 101     Resp 09/23/18 0353 20     Temp 09/23/18 0353 99.3 F (37.4 C)     Temp Source 09/23/18 0353 Oral     SpO2 09/23/18 0353 98 %     Weight 09/23/18  0354 250 lb (113.4 kg)     Height 09/23/18 0354 6' (1.829 m)     Head Circumference --      Peak Flow --      Pain Score 09/23/18 0354 9     Pain Loc --      Pain Edu? --      Excl. in GC? --     Constitutional: Alert and oriented.  Anxious appearing and in mild acute distress. Eyes: Conjunctivae are normal. PERRL. EOMI. Head: Atraumatic. Nose: No congestion/rhinnorhea. Mouth/Throat: Mucous membranes are moist.  Lower lip swelling.  There is no tongue swelling.  There is no hoarse or muffled voice.  There is no drooling.  Tolerating secretions well. Neck: No stridor.  Soft submental space.  No palpable neck masses. Cardiovascular: Normal rate, regular rhythm. Grossly normal heart sounds.  Good peripheral circulation. Respiratory: Normal respiratory effort.  No retractions. Lungs CTAB.  No wheezing. Gastrointestinal: Soft and nontender. No distention. No abdominal bruits. No CVA tenderness. Musculoskeletal: Multiple small developing abscesses to left lower leg, groin and buttocks.  No fluctuance.   Neurologic:  Normal speech and language. No gross focal neurologic deficits are appreciated. No gait instability. Skin:  Skin is warm, dry and intact.  Diffuse urticaria noted. Psychiatric: Mood and affect are normal. Speech and behavior are normal.  ____________________________________________   LABS (all labs ordered are listed, but only abnormal results are displayed)  Labs Reviewed - No data to display ____________________________________________  EKG  None ____________________________________________  RADIOLOGY  ED MD interpretation: None  Official radiology report(s): No results found.  ____________________________________________   PROCEDURES  Procedure(s) performed: None  Procedures  Critical Care performed: Yes, see critical care note(s)   CRITICAL CARE Performed by: Irean Hong   Total critical care time: 45 minutes  Critical care time was exclusive of  separately billable procedures and treating other patients.  Critical care was necessary to treat or prevent imminent or life-threatening deterioration.  Critical care was time spent personally by me on the following activities: development of treatment plan with patient and/or surrogate as well as nursing, discussions with consultants, evaluation of patient's response to treatment, examination of patient, obtaining history from patient or surrogate, ordering and performing treatments and interventions, ordering and review of laboratory studies, ordering and review of radiographic studies, pulse oximetry and re-evaluation of patient's condition.  ____________________________________________   INITIAL IMPRESSION / ASSESSMENT AND PLAN / ED COURSE  As part of my medical decision making, I reviewed the following data within the electronic MEDICAL RECORD NUMBER Nursing notes reviewed and incorporated, Old chart reviewed and Notes from prior ED visits   23 year old male who presents with acute allergic reaction with mild angioedema and urticaria.  Will administer IV allergic reaction cocktail including 25 mg IV Benadryl, 125 mg IV Solu-Medrol, 20 mg IV Pepcid and EpiPen.  Will infuse IV fluids and monitor in the emergency department for a minimum of 3 hours.   Clinical Course as of  Sep 24 711  Tue Sep 23, 2018  0500 Patient resting in no acute distress.  Hives are resolving.  No significant lip or tongue swelling.  We will continue to monitor.   [JS]  0700 Awoke patient from sleep to reassess him. Swelling and hives resolved. Will discharge home with prescriptions for prednisone and Pepcid, EpiPen to use as needed, encouraged Benadryl use.  Will switch patient from Bactrim to clindamycin for his abscess.  Strict return precautions given.  Patient verbalizes understanding agrees with plan of care.   [JS]    Clinical Course User Index [JS] Irean HongSung, Selen Smucker J, MD      ____________________________________________   FINAL CLINICAL IMPRESSION(S) / ED DIAGNOSES  Final diagnoses:  Allergic reaction, initial encounter  Urticaria  Angioedema, initial encounter     ED Discharge Orders    None       Note:  This document was prepared using Dragon voice recognition software and may include unintentional dictation errors.    Irean HongSung, Zyren Sevigny J, MD 09/23/18 [id number]

## 2018-09-23 NOTE — ED Triage Notes (Addendum)
Pt presents to ED with allergic reaction to antibiotic received in ED today for abscess to left leg. Hives noted to arms with blisters present, slight swelling to lips with "burning" sensation present. Pt diaphoretic in triage. Denies sob or difficulty swallowing. States he is feeling very anxious.

## 2018-09-23 NOTE — Discharge Instructions (Addendum)
1. Take the following medicines for the next 4 days: Prednisone 60mg  daily Pepcid 20mg  twice daily 2. Take Benadryl as needed for itching. 3. Use Epi-Pen in case of acute, life-threatening allergic reaction. 4.  Discontinue Bactrim.  Instead take Clindamycin 300 mg 3 times daily x10 days. 5. Return to the ER for worsening symptoms, persistent vomiting, difficulty breathing or other concerns.

## 2019-04-15 ENCOUNTER — Other Ambulatory Visit
Admission: RE | Admit: 2019-04-15 | Discharge: 2019-04-15 | Disposition: A | Discharge status: Home / Self Care | Source: Ambulatory Visit | Attending: Family Medicine | Admitting: Family Medicine

## 2019-04-15 NOTE — ED Notes (Signed)
Pt to ED with [employer] for legal blood draw which was performed at 0138. Pt gave verbal consent for this nurse to draw his blood for officers. Tolerated well.

## 2019-04-19 ENCOUNTER — Other Ambulatory Visit: Payer: Self-pay

## 2019-04-19 ENCOUNTER — Encounter: Payer: Self-pay | Admitting: Emergency Medicine

## 2019-04-19 ENCOUNTER — Emergency Department
Admission: EM | Admit: 2019-04-19 | Discharge: 2019-04-19 | Disposition: A | Discharge status: Home / Self Care | Attending: Emergency Medicine | Admitting: Emergency Medicine

## 2019-04-19 DIAGNOSIS — R220 Localized swelling, mass and lump, head: Secondary | ICD-10-CM | POA: Insufficient documentation

## 2019-04-19 DIAGNOSIS — F17290 Nicotine dependence, other tobacco product, uncomplicated: Secondary | ICD-10-CM | POA: Insufficient documentation

## 2019-04-19 DIAGNOSIS — L259 Unspecified contact dermatitis, unspecified cause: Secondary | ICD-10-CM | POA: Insufficient documentation

## 2019-04-19 DIAGNOSIS — Z79899 Other long term (current) drug therapy: Secondary | ICD-10-CM | POA: Insufficient documentation

## 2019-04-19 MED ORDER — PREDNISONE 10 MG (21) PO TBPK: ORAL_TABLET | ORAL | 0 refills | Status: AC

## 2019-04-19 NOTE — ED Provider Notes (Signed)
[facility] Emergency Department Provider Note   ____________________________________________   I have reviewed the triage vital signs and the nursing notes.   HISTORY  Chief Complaint Allergic Reaction   History limited by: Not Limited   HPI Keith Jacobson is a 23 y.o. male who presents to the emergency department today because of concerns for possible allergic reaction.  Patient is unaware of any allergens however starting yesterday noticed some swelling and burning to his eyes as well as his his forehead.  Patient states that he did try taking a Benadryl without any significant relief.  The patient denies similar symptoms in the past.  Patient denies any nausea or vomiting.  Denies any fevers.  Records reviewed.   Past Medical History:  Diagnosis Date  . Medical history non-contributory     There are no active problems to display for this patient.   Past Surgical History:  Procedure Laterality Date  . NO PAST SURGERIES    . ORIF ANKLE FRACTURE Left 05/27/2015   Procedure: OPEN REDUCTION INTERNAL FIXATION (ORIF) ANKLE FRACTURE;  Surgeon: Gwyneth RevelsJustin Fowler, DPM;  Location: [facility] ORS;  Service: Podiatry;  Laterality: Left;    Prior to Admission medications   Medication Sig Start Date End Date Taking? Authorizing Provider  cephALEXin (KEFLEX) 500 MG capsule Take 1 capsule (500 mg total) by mouth 3 (three) times daily. 07/28/18   Sharman CheekStafford, Phillip, MD  clindamycin (CLEOCIN) 300 MG capsule Take 1 capsule (300 mg total) by mouth 3 (three) times daily. 09/23/18   Irean HongSung, Jade J, MD  famotidine (PEPCID) 20 MG tablet Take 1 tablet (20 mg total) by mouth 2 (two) times daily. 09/23/18   Irean HongSung, Jade J, MD  ibuprofen (ADVIL,MOTRIN) 600 MG tablet Take 1 tablet (600 mg total) by mouth every 8 (eight) hours as needed. 09/22/18   Joni ReiningSmith, Ronald K, PA-C  ibuprofen (ADVIL,MOTRIN) 800 MG tablet Take 1 tablet (800 mg total) by mouth every 8 (eight) hours as needed for moderate  pain. 10/14/17   Joni ReiningSmith, Ronald K, PA-C  meloxicam (MOBIC) 15 MG tablet Take 1 tablet (15 mg total) by mouth daily. 05/23/16   Cuthriell, Delorise RoyalsJonathan D, PA-C  naproxen (NAPROSYN) 500 MG tablet Take 1 tablet (500 mg total) by mouth 2 (two) times daily with a meal. 12/17/17   Joni ReiningSmith, Ronald K, PA-C  oxyCODONE-acetaminophen (PERCOCET) 7.5-325 MG tablet Take 1 tablet by mouth every 6 (six) hours as needed for severe pain. 10/14/17   Joni ReiningSmith, Ronald K, PA-C  oxyCODONE-acetaminophen (PERCOCET) 7.5-325 MG tablet Take 1 tablet by mouth every 6 (six) hours as needed. 09/22/18   Joni ReiningSmith, Ronald K, PA-C  oxyCODONE-acetaminophen (PERCOCET/ROXICET) 5-325 MG per tablet Take 1 tablet by mouth every 4 (four) hours as needed for severe pain. 05/26/15   Darci CurrentBrown, [place] N, MD  predniSONE (DELTASONE) 20 MG tablet 3 tablets PO qd x 4 days 09/23/18   Irean HongSung, Jade J, MD  sulfamethoxazole-trimethoprim (BACTRIM DS) 800-160 MG tablet Take 1 tablet by mouth 2 (two) times daily. 07/28/18   Sharman CheekStafford, Phillip, MD  sulfamethoxazole-trimethoprim (BACTRIM DS,SEPTRA DS) 800-160 MG tablet Take 1 tablet by mouth 2 (two) times daily. 09/22/18   Joni ReiningSmith, Ronald K, PA-C    Allergies Bactrim [sulfamethoxazole-trimethoprim]  No family history on file.  Social History Social History   Tobacco Use  . Smoking status: Current Every Day Smoker    Packs/day: 0.00    Types: Cigars  . Smokeless tobacco: Never Used  Substance Use Topics  . Alcohol use: No  .  Drug use: No    Review of Systems Constitutional: No fever/chills Eyes: Positive for eyelid swelling. ENT: No sore throat. Cardiovascular: Denies chest pain. Respiratory: Denies shortness of breath. Gastrointestinal: No abdominal pain.  No nausea, no vomiting.  No diarrhea.   Genitourinary: Negative for dysuria. Musculoskeletal: Negative for back pain. Skin: Positive for rash to forehead. Neurological: Negative for headaches, focal weakness or  numbness.  ____________________________________________   PHYSICAL EXAM:  VITAL SIGNS: ED Triage Vitals  Enc Vitals Group     BP 04/19/19 1030 (!) 163/93     Pulse Rate 04/19/19 1030 76     Resp 04/19/19 1030 16     Temp 04/19/19 1030 97.7 F (36.5 C)     Temp Source 04/19/19 1030 Oral     SpO2 04/19/19 1030 99 %     Weight --      Height --      Head Circumference --      Peak Flow --      Pain Score 04/19/19 1028 8    Constitutional: Alert and oriented.  Eyes: Conjunctivae are normal. Slight swelling noted to eyelids. ENT      Head: Normocephalic and atraumatic.      Nose: No congestion/rhinnorhea.      Mouth/Throat: Mucous membranes are moist.      Neck: No stridor. Hematological/Lymphatic/Immunilogical: No cervical lymphadenopathy. Cardiovascular: Normal rate, regular rhythm.  No murmurs, rubs, or gallops.  Respiratory: Normal respiratory effort without tachypnea nor retractions. Breath sounds are clear and equal bilaterally. No wheezes/rales/rhonchi. Gastrointestinal: Soft and non tender. No rebound. No guarding.  Genitourinary: Deferred Musculoskeletal: Normal range of motion in all extremities. No lower extremity edema. Neurologic:  Normal speech and language. No gross focal neurologic deficits are appreciated.  Skin:  Slight rash noted to forehead. Psychiatric: Mood and affect are normal. Speech and behavior are normal. Patient exhibits appropriate insight and judgment.  ____________________________________________    LABS (pertinent positives/negatives)  None  ____________________________________________   EKG  None  ____________________________________________    RADIOLOGY  None  ____________________________________________   PROCEDURES  Procedures  ____________________________________________   INITIAL IMPRESSION / ASSESSMENT AND PLAN / ED COURSE  Pertinent labs & imaging results that were available during my care of the patient  were reviewed by me and considered in my medical decision making (see chart for details).   Patient presented to the emergency department today because of concerns for possible allergic reaction.  On exam patient does have some erythema and rash to the lower forehead and some swelling to the eyelids.  It appears to be consistent with a contact dermatitis.  Patient does not recall any new exposures.  This point I do not think patient suffering from anaphylactic reaction.  Will give patient prescription for steroids.  Discussed this with the patient.  ____________________________________________   FINAL CLINICAL IMPRESSION(S) / ED DIAGNOSES  Final diagnoses:  Contact dermatitis, unspecified contact dermatitis type, unspecified trigger     Note: This dictation was prepared with Dragon dictation. Any transcriptional errors that result from this process are unintentional     Nance Pear, MD 04/19/19 1318

## 2019-04-19 NOTE — Discharge Instructions (Addendum)
Please seek medical attention for any high fevers, chest pain, shortness of breath, change in behavior, persistent vomiting, bloody stool or any other new or concerning symptoms.  

## 2019-04-19 NOTE — ED Notes (Signed)
Pt states that on Monday he noticed eye redness and swelling bilaterally. States noticed today that upper lip is swollen. Denies tongue swelling or throat swelling. No SOB. Able to talk in complete sentences. Denies eating any new foods. Denies taking BP meds. Denies any new things in last week. States he takes 25mg  benadryl daily. Denies taking claritin or zyrtec this week, has taken in the past. A&O, no distress noted.

## 2019-04-19 NOTE — ED Triage Notes (Signed)
Pt to ED via POV c/o facial swelling that started this morning. Pt is unsure what he is allergic to. Pt is in NAD.

## 2021-04-01 ENCOUNTER — Other Ambulatory Visit: Payer: Self-pay

## 2021-04-01 ENCOUNTER — Encounter: Payer: Self-pay | Admitting: Emergency Medicine

## 2021-04-01 ENCOUNTER — Emergency Department
Admission: EM | Admit: 2021-04-01 | Discharge: 2021-04-01 | Disposition: A | Discharge status: Home / Self Care | Payer: Self-pay | Attending: Emergency Medicine | Admitting: Emergency Medicine

## 2021-04-01 DIAGNOSIS — Z20822 Contact with and (suspected) exposure to covid-19: Secondary | ICD-10-CM | POA: Insufficient documentation

## 2021-04-01 DIAGNOSIS — F17210 Nicotine dependence, cigarettes, uncomplicated: Secondary | ICD-10-CM | POA: Insufficient documentation

## 2021-04-01 DIAGNOSIS — J039 Acute tonsillitis, unspecified: Secondary | ICD-10-CM | POA: Insufficient documentation

## 2021-04-01 LAB — GROUP A STREP BY PCR: Group A Strep by PCR: NOT DETECTED

## 2021-04-01 MED ORDER — DIPHENHYDRAMINE HCL 12.5 MG/5ML PO ELIX
12.5000 mg | ORAL_SOLUTION | Freq: Once | ORAL | Status: AC
  Administered 2021-04-01: 12.5 mg via ORAL
  Filled 2021-04-01: qty 5

## 2021-04-01 MED ORDER — LIDOCAINE VISCOUS HCL 2 % MT SOLN
15.0000 mL | Freq: Once | OROMUCOSAL | Status: AC
  Administered 2021-04-01: 15 mL via OROMUCOSAL
  Filled 2021-04-01: qty 15

## 2021-04-01 MED ORDER — LIDOCAINE VISCOUS HCL 2 % MT SOLN: 5.0000 mL | Freq: Four times a day (QID) | OROMUCOSAL | 0 refills | Status: AC | PRN

## 2021-04-01 MED ORDER — ACETAMINOPHEN 325 MG PO TABS
650.0000 mg | ORAL_TABLET | Freq: Once | ORAL | Status: AC
  Administered 2021-04-01: 650 mg via ORAL
  Filled 2021-04-01: qty 2

## 2021-04-01 MED ORDER — METHYLPREDNISOLONE SODIUM SUCC 125 MG IJ SOLR
125.0000 mg | Freq: Once | INTRAMUSCULAR | Status: AC
  Administered 2021-04-01: 125 mg via INTRAMUSCULAR
  Filled 2021-04-01: qty 2

## 2021-04-01 MED ORDER — PSEUDOEPH-BROMPHEN-DM 30-2-10 MG/5ML PO SYRP: 5.0000 mL | ORAL_SOLUTION | Freq: Four times a day (QID) | ORAL | 0 refills | Status: AC | PRN

## 2021-04-01 MED ORDER — AMOXICILLIN 875 MG PO TABS: 875.0000 mg | ORAL_TABLET | Freq: Two times a day (BID) | ORAL | 0 refills | Status: DC

## 2021-04-01 MED ORDER — METHYLPREDNISOLONE 4 MG PO TBPK: ORAL_TABLET | ORAL | 0 refills | Status: AC

## 2021-04-01 NOTE — Discharge Instructions (Addendum)
Read and follow discharge care instruction.  Take medication as directed.  Results of COVID-19 test can be found later today in the MyChart app.

## 2021-04-01 NOTE — ED Provider Notes (Signed)
[facility] Emergency Department Provider Note   ____________________________________________   Event Date/Time   First MD Initiated Contact with Patient 04/01/21 1112     (approximate)  I have reviewed the triage vital signs and the nursing notes.   HISTORY  Chief Complaint Fever and Sore Throat    HPI Keith Jacobson is a 25 y.o. male patient presents with several days of sore throat and a fever last night.  Patient had difficulty with swallowing fluids, soft foods and solids.  Denies recent travel or known contact with COVID-19.  Patient has taken two vaccines for COVID-19 but no booster.  Rates his pain/discomfort a 9/10.  Described pain as "sore/achy".  No palliative measure for complaint.  History recurrent pharyngitis/tonsillitis.         Past Medical History:  Diagnosis Date   Medical history non-contributory     There are no problems to display for this patient.   Past Surgical History:  Procedure Laterality Date   NO PAST SURGERIES     ORIF ANKLE FRACTURE Left 05/27/2015   Procedure: OPEN REDUCTION INTERNAL FIXATION (ORIF) ANKLE FRACTURE;  Surgeon: Gwyneth Revels, DPM;  Location: [facility] ORS;  Service: Podiatry;  Laterality: Left;    Prior to Admission medications   Medication Sig Start Date End Date Taking? Authorizing Provider  amoxicillin (AMOXIL) 875 MG tablet Take 1 tablet (875 mg total) by mouth 2 (two) times daily. 04/01/21  Yes Joni Reining, PA-C  brompheniramine-pseudoephedrine-DM 30-2-10 MG/5ML syrup Take 5 mLs by mouth 4 (four) times daily as needed. Mix with 5 mL of viscous lidocaine for swish and swallow 04/01/21  Yes Joni Reining, PA-C  lidocaine (XYLOCAINE) 2 % solution Use as directed 5 mLs in the mouth or throat every 6 (six) hours as needed for mouth pain. Next with 5 mL of Bromfed-DM for swish and swallow 04/01/21  Yes Joni Reining, PA-C  methylPREDNISolone (MEDROL DOSEPAK) 4 MG TBPK tablet Take Tapered dose as  directed 04/01/21  Yes Joni Reining, PA-C  cephALEXin (KEFLEX) 500 MG capsule Take 1 capsule (500 mg total) by mouth 3 (three) times daily. Patient not taking: Reported on 04/19/2019 07/28/18   Sharman Cheek, MD  clindamycin (CLEOCIN) 300 MG capsule Take 1 capsule (300 mg total) by mouth 3 (three) times daily. Patient not taking: Reported on 04/19/2019 09/23/18   Irean Hong, MD  famotidine (PEPCID) 20 MG tablet Take 1 tablet (20 mg total) by mouth 2 (two) times daily. Patient not taking: Reported on 04/19/2019 09/23/18   Irean Hong, MD  ibuprofen (ADVIL,MOTRIN) 600 MG tablet Take 1 tablet (600 mg total) by mouth every 8 (eight) hours as needed. Patient not taking: Reported on 04/19/2019 09/22/18   Joni Reining, PA-C  ibuprofen (ADVIL,MOTRIN) 800 MG tablet Take 1 tablet (800 mg total) by mouth every 8 (eight) hours as needed for moderate pain. Patient not taking: Reported on 04/19/2019 10/14/17   Joni Reining, PA-C  meloxicam (MOBIC) 15 MG tablet Take 1 tablet (15 mg total) by mouth daily. Patient not taking: Reported on 04/19/2019 05/23/16   Cuthriell, Delorise Royals, PA-C  naproxen (NAPROSYN) 500 MG tablet Take 1 tablet (500 mg total) by mouth 2 (two) times daily with a meal. Patient not taking: Reported on 04/19/2019 12/17/17   Joni Reining, PA-C  oxyCODONE-acetaminophen (PERCOCET) 7.5-325 MG tablet Take 1 tablet by mouth every 6 (six) hours as needed for severe pain. Patient not taking: Reported on 04/19/2019 10/14/17  Joni Reining, PA-C  oxyCODONE-acetaminophen (PERCOCET) 7.5-325 MG tablet Take 1 tablet by mouth every 6 (six) hours as needed. Patient not taking: Reported on 04/19/2019 09/22/18   Joni Reining, PA-C  oxyCODONE-acetaminophen (PERCOCET/ROXICET) 5-325 MG per tablet Take 1 tablet by mouth every 4 (four) hours as needed for severe pain. Patient not taking: Reported on 04/19/2019 05/26/15   Darci Current, MD  predniSONE (DELTASONE) 20 MG tablet 3 tablets PO qd x 4 days Patient not taking:  Reported on 04/19/2019 09/23/18   Irean Hong, MD  predniSONE (STERAPRED UNI-PAK 21 TAB) 10 MG (21) TBPK tablet Per packaging instructions 04/19/19   Phineas Semen, MD  sulfamethoxazole-trimethoprim (BACTRIM DS) 800-160 MG tablet Take 1 tablet by mouth 2 (two) times daily. Patient not taking: Reported on 04/19/2019 07/28/18   Sharman Cheek, MD  sulfamethoxazole-trimethoprim (BACTRIM DS,SEPTRA DS) 800-160 MG tablet Take 1 tablet by mouth 2 (two) times daily. Patient not taking: Reported on 04/19/2019 09/22/18   Joni Reining, PA-C    Allergies Bactrim [sulfamethoxazole-trimethoprim]  No family history on file.  Social History Social History   Tobacco Use   Smoking status: Every Day    Packs/day: 0.00    Types: Cigars, Cigarettes   Smokeless tobacco: Never  Vaping Use   Vaping Use: Never used  Substance Use Topics   Alcohol use: No   Drug use: No    Review of Systems  Constitutional: No fever/chills Eyes: No visual changes. ENT: Sore throat. Cardiovascular: Denies chest pain. Respiratory: Denies shortness of breath. Gastrointestinal: No abdominal pain.  No nausea, no vomiting.  No diarrhea.  No constipation. Genitourinary: Negative for dysuria. Musculoskeletal: Negative for back pain. Skin: Negative for rash. Neurological: Negative for headaches, focal weakness or numbness. Allergic/Immunilogical: Bactrim ____________________________________________   PHYSICAL EXAM:  VITAL SIGNS: ED Triage Vitals  Enc Vitals Group     BP 04/01/21 1053 (!) 152/97     Pulse Rate 04/01/21 1053 80     Resp 04/01/21 1053 18     Temp 04/01/21 1053 98.3 F (36.8 C)     Temp Source 04/01/21 1053 Oral     SpO2 04/01/21 1053 96 %     Weight 04/01/21 1049 245 lb (111.1 kg)     Height 04/01/21 1049 6\' 1"  (1.854 m)     Head Circumference --      Peak Flow --      Pain Score 04/01/21 1049 9     Pain Loc --      Pain Edu? --      Excl. in GC? --     Constitutional: Alert and oriented.  Well appearing and in no acute distress. Eyes: Conjunctivae are normal. PERRL. EOMI. Head: Atraumatic. Nose: No congestion/rhinnorhea. Mouth/Throat: Mucous membranes are moist.  Oropharynx erythematous.  Edematous edematous bilateral tonsil without exudate.   Neck: No stridor.   Hematological/Lymphatic/Immunilogical: No cervical lymphadenopathy. Cardiovascular: Normal rate, regular rhythm. Grossly normal heart sounds.  Good peripheral circulation. Respiratory: Normal respiratory effort.  No retractions. Lungs CTAB. Gastrointestinal: Soft and nontender. No distention. No abdominal bruits. No CVA tenderness. Musculoskeletal: No lower extremity tenderness nor edema.  No joint effusions. Neurologic:  Normal speech and language. No gross focal neurologic deficits are appreciated. No gait instability. Skin:  Skin is warm, dry and intact. No rash noted. Psychiatric: Mood and affect are normal. Speech and behavior are normal.  ____________________________________________   LABS (all labs ordered are listed, but only abnormal results are displayed)  Labs Reviewed  GROUP A STREP BY PCR  SARS CORONAVIRUS 2 (TAT 6-24 HRS)   ____________________________________________  EKG   ____________________________________________  RADIOLOGY I, Joni Reining, personally viewed and evaluated these images (plain radiographs) as part of my medical decision making, as well as reviewing the written report by the radiologist.  ED MD interpretation:    Official radiology report(s): No results found.  ____________________________________________   PROCEDURES  Procedure(s) performed (including Critical Care):  Procedures   ____________________________________________   INITIAL IMPRESSION / ASSESSMENT AND PLAN / ED COURSE  As part of my medical decision making, I reviewed the following data within the electronic MEDICAL RECORD NUMBER         Patient presents with sore throat with history  recurrent tonsillitis.  We will treat prophylactically with antibiotics even though strep test was negative.  Patient given discharge care instruction advised that COVID-19 test results will be available later in the MyChart app.      ____________________________________________   FINAL CLINICAL IMPRESSION(S) / ED DIAGNOSES  Final diagnoses:  Tonsillitis     ED Discharge Orders          Ordered    amoxicillin (AMOXIL) 875 MG tablet  2 times daily        04/01/21 1306    methylPREDNISolone (MEDROL DOSEPAK) 4 MG TBPK tablet        04/01/21 1306    lidocaine (XYLOCAINE) 2 % solution  Every 6 hours PRN        04/01/21 1306    brompheniramine-pseudoephedrine-DM 30-2-10 MG/5ML syrup  4 times daily PRN        04/01/21 1306             Note:  This document was prepared using Dragon voice recognition software and may include unintentional dictation errors.    Joni Reining, PA-C 04/01/21 1312    Delton Prairie, MD 04/01/21 [phone]

## 2021-04-01 NOTE — ED Triage Notes (Signed)
Pt reports sore throat for several days and states ran a fever last pm

## 2021-04-02 LAB — SARS CORONAVIRUS 2 (TAT 6-24 HRS): SARS Coronavirus 2: NEGATIVE

## 2021-08-24 ENCOUNTER — Encounter: Payer: Self-pay | Admitting: *Deleted

## 2021-08-24 ENCOUNTER — Emergency Department
Admission: EM | Admit: 2021-08-24 | Discharge: 2021-08-24 | Disposition: A | Discharge status: Home / Self Care | Payer: Self-pay | Attending: Student in an Organized Health Care Education/Training Program | Admitting: Student in an Organized Health Care Education/Training Program

## 2021-08-24 ENCOUNTER — Other Ambulatory Visit: Payer: Self-pay

## 2021-08-24 DIAGNOSIS — Z87891 Personal history of nicotine dependence: Secondary | ICD-10-CM | POA: Insufficient documentation

## 2021-08-24 DIAGNOSIS — L089 Local infection of the skin and subcutaneous tissue, unspecified: Secondary | ICD-10-CM

## 2021-08-24 DIAGNOSIS — L723 Sebaceous cyst: Secondary | ICD-10-CM | POA: Insufficient documentation

## 2021-08-24 MED ORDER — HYDROCODONE-ACETAMINOPHEN 5-325 MG PO TABS
1.0000 | ORAL_TABLET | Freq: Once | ORAL | Status: AC
  Administered 2021-08-24: 1 via ORAL
  Filled 2021-08-24: qty 1

## 2021-08-24 MED ORDER — DOXYCYCLINE HYCLATE 100 MG PO TABS: 100.0000 mg | ORAL_TABLET | Freq: Two times a day (BID) | ORAL | 0 refills | Status: DC

## 2021-08-24 MED ORDER — DOXYCYCLINE HYCLATE 100 MG PO TABS
100.0000 mg | ORAL_TABLET | Freq: Once | ORAL | Status: AC
  Administered 2021-08-24: 100 mg via ORAL
  Filled 2021-08-24: qty 1

## 2021-08-24 NOTE — ED Notes (Signed)
Patient declined discharge vital signs. 

## 2021-08-24 NOTE — ED Triage Notes (Signed)
Pt has an abscess to right forearm.  Sx for 1 week.  Area red and swollen with drainage.  Pt alert.

## 2021-08-24 NOTE — ED Provider Notes (Signed)
[facility] Emergency Department Provider Note  ____________________________________________  Time seen: Approximately 9:55 PM  I have reviewed the triage vital signs and the nursing notes.   HISTORY  Chief Complaint Abscess    HPI Keith Jacobson is a 25 y.o. male who presents the emergency department concern for possible infection/cyst.  Patient states that he has a history of recurring cyst but typically does not have infections.  Patient states that he had a small area on his arm that appeared to be a cyst, then he developed what he believes is an infection over top.  It is spontaneously draining at this time.  No fevers or chills.  Does not involve the joint.  It is on the posterior aspect of the forearm       Past Medical History:  Diagnosis Date   Medical history non-contributory     There are no problems to display for this patient.   Past Surgical History:  Procedure Laterality Date   NO PAST SURGERIES     ORIF ANKLE FRACTURE Left 05/27/2015   Procedure: OPEN REDUCTION INTERNAL FIXATION (ORIF) ANKLE FRACTURE;  Surgeon: Gwyneth Revels, DPM;  Location: [facility] ORS;  Service: Podiatry;  Laterality: Left;    Prior to Admission medications   Medication Sig Start Date End Date Taking? Authorizing Provider  amoxicillin (AMOXIL) 875 MG tablet Take 1 tablet (875 mg total) by mouth 2 (two) times daily. 04/01/21   Joni Reining, PA-C  brompheniramine-pseudoephedrine-DM 30-2-10 MG/5ML syrup Take 5 mLs by mouth 4 (four) times daily as needed. Mix with 5 mL of viscous lidocaine for swish and swallow 04/01/21   Joni Reining, PA-C  cephALEXin (KEFLEX) 500 MG capsule Take 1 capsule (500 mg total) by mouth 3 (three) times daily. Patient not taking: Reported on 04/19/2019 07/28/18   Sharman Cheek, MD  clindamycin (CLEOCIN) 300 MG capsule Take 1 capsule (300 mg total) by mouth 3 (three) times daily. Patient not taking: Reported on 04/19/2019 09/23/18   Irean Hong, MD  famotidine (PEPCID) 20 MG tablet Take 1 tablet (20 mg total) by mouth 2 (two) times daily. Patient not taking: Reported on 04/19/2019 09/23/18   Irean Hong, MD  ibuprofen (ADVIL,MOTRIN) 600 MG tablet Take 1 tablet (600 mg total) by mouth every 8 (eight) hours as needed. Patient not taking: Reported on 04/19/2019 09/22/18   Joni Reining, PA-C  ibuprofen (ADVIL,MOTRIN) 800 MG tablet Take 1 tablet (800 mg total) by mouth every 8 (eight) hours as needed for moderate pain. Patient not taking: Reported on 04/19/2019 10/14/17   Joni Reining, PA-C  lidocaine (XYLOCAINE) 2 % solution Use as directed 5 mLs in the mouth or throat every 6 (six) hours as needed for mouth pain. Next with 5 mL of Bromfed-DM for swish and swallow 04/01/21   Joni Reining, PA-C  meloxicam (MOBIC) 15 MG tablet Take 1 tablet (15 mg total) by mouth daily. Patient not taking: Reported on 04/19/2019 05/23/16   Glendell Schlottman, Delorise Royals, PA-C  methylPREDNISolone (MEDROL DOSEPAK) 4 MG TBPK tablet Take Tapered dose as directed 04/01/21   Joni Reining, PA-C  naproxen (NAPROSYN) 500 MG tablet Take 1 tablet (500 mg total) by mouth 2 (two) times daily with a meal. Patient not taking: Reported on 04/19/2019 12/17/17   Joni Reining, PA-C  oxyCODONE-acetaminophen (PERCOCET) 7.5-325 MG tablet Take 1 tablet by mouth every 6 (six) hours as needed for severe pain. Patient not taking: Reported on 04/19/2019 10/14/17  Joni Reining, PA-C  oxyCODONE-acetaminophen (PERCOCET) 7.5-325 MG tablet Take 1 tablet by mouth every 6 (six) hours as needed. Patient not taking: Reported on 04/19/2019 09/22/18   Joni Reining, PA-C  oxyCODONE-acetaminophen (PERCOCET/ROXICET) 5-325 MG per tablet Take 1 tablet by mouth every 4 (four) hours as needed for severe pain. Patient not taking: Reported on 04/19/2019 05/26/15   Darci Current, MD  predniSONE (DELTASONE) 20 MG tablet 3 tablets PO qd x 4 days Patient not taking: Reported on 04/19/2019 09/23/18   Irean Hong, MD   predniSONE (STERAPRED UNI-PAK 21 TAB) 10 MG (21) TBPK tablet Per packaging instructions 04/19/19   Phineas Semen, MD  sulfamethoxazole-trimethoprim (BACTRIM DS) 800-160 MG tablet Take 1 tablet by mouth 2 (two) times daily. Patient not taking: Reported on 04/19/2019 07/28/18   Sharman Cheek, MD  sulfamethoxazole-trimethoprim (BACTRIM DS,SEPTRA DS) 800-160 MG tablet Take 1 tablet by mouth 2 (two) times daily. Patient not taking: Reported on 04/19/2019 09/22/18   Joni Reining, PA-C    Allergies Bactrim [sulfamethoxazole-trimethoprim]  No family history on file.  Social History Social History   Tobacco Use   Smoking status: Former    Packs/day: 0.00    Types: Cigars, Cigarettes   Smokeless tobacco: Never  Vaping Use   Vaping Use: Never used  Substance Use Topics   Alcohol use: No   Drug use: No     Review of Systems  Constitutional: No fever/chills Eyes: No visual changes. No discharge ENT: No upper respiratory complaints. Cardiovascular: no chest pain. Respiratory: no cough. No SOB. Gastrointestinal: No abdominal pain.  No nausea, no vomiting.  Musculoskeletal: Infected cyst to the right arm Skin: Negative for rash, abrasions, lacerations, ecchymosis. Neurological: Negative for headaches, focal weakness or numbness.  10 System ROS otherwise negative.  ____________________________________________   PHYSICAL EXAM:  VITAL SIGNS: ED Triage Vitals  Enc Vitals Group     BP 08/24/21 2149 (!) 153/78     Pulse Rate 08/24/21 2147 86     Resp 08/24/21 2147 20     Temp 08/24/21 2147 98.3 F (36.8 C)     Temp Source 08/24/21 2147 Oral     SpO2 08/24/21 2147 96 %     Weight 08/24/21 2147 (!) 310 lb (140.6 kg)     Height 08/24/21 2147 6\' 1"  (1.854 m)     Head Circumference --      Peak Flow --      Pain Score 08/24/21 2147 10     Pain Loc --      Pain Edu? --      Excl. in GC? --      Constitutional: Alert and oriented. Well appearing and in no acute  distress. Eyes: Conjunctivae are normal. PERRL. EOMI. Head: Atraumatic. ENT:      Ears:       Nose: No congestion/rhinnorhea.      Mouth/Throat: Mucous membranes are moist.  Neck: No stridor.    Cardiovascular: Normal rate, regular rhythm. Normal S1 and S2.  Good peripheral circulation. Respiratory: Normal respiratory effort without tachypnea or retractions. Lungs CTAB. Good air entry to the bases with no decreased or absent breath sounds. Musculoskeletal: Full range of motion to all extremities. No gross deformities appreciated.  Visualization of the right forearm reveals an erythematous lesion to the posterior right forearm.  This measures approximately 2 cm in diameter.  It is leaking a small amount of purulent drainage.  Palpation of the area reveals no fluctuance.  There is  a area under this infection that appears to be circumscribed such as a dermal cyst.  Total area of erythema measures approximately 5 cm in diameter Neurologic:  Normal speech and language. No gross focal neurologic deficits are appreciated.  Skin:  Skin is warm, dry and intact. No rash noted. Psychiatric: Mood and affect are normal. Speech and behavior are normal. Patient exhibits appropriate insight and judgement.   ____________________________________________   LABS (all labs ordered are listed, but only abnormal results are displayed)  Labs Reviewed - No data to display ____________________________________________  EKG   ____________________________________________  RADIOLOGY   No results found.  ____________________________________________    PROCEDURES  Procedure(s) performed:    Procedures    Medications  doxycycline (VIBRA-TABS) tablet 100 mg (100 mg Oral Given 08/24/21 2235)  HYDROcodone-acetaminophen (NORCO/VICODIN) 5-325 MG per tablet 1 tablet (1 tablet Oral Given 08/24/21 2235)     ____________________________________________   INITIAL IMPRESSION / ASSESSMENT AND PLAN / ED  COURSE  Pertinent labs & imaging results that were available during my care of the patient were reviewed by me and considered in my medical decision making (see chart for details).  Review of the [place] CSRS was performed in accordance of the [facility] prior to dispensing any controlled drugs.           Patient's diagnosis is consistent with infected sebaceous cyst.  This is currently draining minimal amount of purulence.  It appears that patient has a sebaceous cyst under this area.  As it is already draining I will not incise the area.  I will place the patient on antibiotics and limited pain medication.  Follow-up with primary care as needed.  I would recommend following up with general surgery or dermatology if the cyst causes any pain or issues after infection has resolved..  Patient is given ED precautions to return to the ED for any worsening or new symptoms.     ____________________________________________  FINAL CLINICAL IMPRESSION(S) / ED DIAGNOSES  Final diagnoses:  Infected sebaceous cyst      NEW MEDICATIONS STARTED DURING THIS VISIT:  ED Discharge Orders     None           This chart was dictated using voice recognition software/Dragon. Despite best efforts to proofread, errors can occur which can change the meaning. Any change was purely unintentional.    Racheal Patches, PA-C 08/24/21 2301    Willy Eddy, MD 08/24/21 [phone]

## 2021-10-22 ENCOUNTER — Other Ambulatory Visit: Payer: Self-pay

## 2021-10-22 ENCOUNTER — Emergency Department
Admission: EM | Admit: 2021-10-22 | Discharge: 2021-10-22 | Disposition: A | Discharge status: Home / Self Care | Payer: Self-pay | Attending: Emergency Medicine | Admitting: Emergency Medicine

## 2021-10-22 DIAGNOSIS — Z20822 Contact with and (suspected) exposure to covid-19: Secondary | ICD-10-CM | POA: Insufficient documentation

## 2021-10-22 DIAGNOSIS — J039 Acute tonsillitis, unspecified: Secondary | ICD-10-CM | POA: Insufficient documentation

## 2021-10-22 LAB — RESP PANEL BY RT-PCR (FLU A&B, COVID) ARPGX2
Influenza A by PCR: NEGATIVE
Influenza B by PCR: NEGATIVE
SARS Coronavirus 2 by RT PCR: NEGATIVE

## 2021-10-22 LAB — GROUP A STREP BY PCR: Group A Strep by PCR: NOT DETECTED

## 2021-10-22 MED ORDER — DEXAMETHASONE 10 MG/ML FOR PEDIATRIC ORAL USE
10.0000 mg | Freq: Once | INTRAMUSCULAR | Status: AC
  Administered 2021-10-22: 10 mg via ORAL
  Filled 2021-10-22: qty 1

## 2021-10-22 MED ORDER — AMOXICILLIN 500 MG PO CAPS: 500.0000 mg | ORAL_CAPSULE | Freq: Two times a day (BID) | ORAL | 0 refills | Status: AC

## 2021-10-22 MED ORDER — DEXAMETHASONE 1 MG/ML PO CONC
10.0000 mg | Freq: Once | ORAL | Status: DC
  Filled 2021-10-22: qty 10

## 2021-10-22 NOTE — ED Provider Notes (Signed)
[facility] Provider Note    None    (approximate)   History   Chief Complaint Sore Throat   HPI Keith Jacobson is a 26 y.o. male, history of recurrent tonsillitis, presents to the emergency department for evaluation of sore throat.  Patient states that he has been having a sore throat for the past week with reported fevers as high as 103 degrees Fahrenheit.  Patient states that he has been experiencing difficulty eating and drinking due to the pain.  Denies chest pain, shortness of breath, back pain, sinus congestion, cough, ear pain, blurred vision, or nausea/vomiting  History Limitations: No limitations      Physical Exam  Triage Vital Signs: ED Triage Vitals [10/22/21 2045]  Enc Vitals Group     BP (!) 152/95     Pulse Rate 63     Resp 16     Temp 98.4 F (36.9 C)     Temp Source Oral     SpO2 98 %     Weight 260 lb (117.9 kg)     Height 6' (1.829 m)     Head Circumference      Peak Flow      Pain Score 8     Pain Loc      Pain Edu?      Excl. in GC?     Most recent vital signs: Vitals:   10/22/21 2045  BP: (!) 152/95  Pulse: 63  Resp: 16  Temp: 98.4 F (36.9 C)  SpO2: 98%    General: Awake, NAD.  CV: Good peripheral perfusion.  Resp: Normal effort.  Abd: Soft, non-tender. No distention.  Neuro: At baseline. No gross neurological deficits. Other: Diffusely erythematous throat with significant tonsillar swelling and exudates.  Uvula midline.  Submandibular lymphadenopathy present bilaterally  Physical Exam    ED Results / Procedures / Treatments  Labs (all labs ordered are listed, but only abnormal results are displayed) Labs Reviewed  RESP PANEL BY RT-PCR (FLU A&B, COVID) [id number]  GROUP A STREP BY PCR     EKG Not applicable.   RADIOLOGY  ED Provider Interpretation: Not applicable  No results found.  PROCEDURES:  Critical Care performed: None.  Procedures    MEDICATIONS ORDERED IN  ED: Medications  dexamethasone (DECADRON) 10 MG/ML injection for Pediatric ORAL use 10 mg (10 mg Oral Given 10/22/21 2155)     IMPRESSION / MDM / ASSESSMENT AND PLAN / ED COURSE  I reviewed the triage vital signs and the nursing notes.                              Keith Jacobson is a 26 y.o. male, history of recurrent tonsillitis, presents to the emergency department for evaluation of sore throat.  Patient states that he has been having a sore throat for the past week with reported fevers as high as 103 degrees Fahrenheit.  Patient states that he has been experiencing difficulty eating and drinking due to the pain.    Differential diagnosis includes, but is not limited to, strep pharyngitis, tonsillitis, influenza, COVID-19, sinusitis, allergic rhinitis  ED Course Patient appears well.  Vital signs within normal limits.  Afebrile.  Respiratory panel negative for influenza or COVID-19.  Strep a PCR negative  Assessment/Plan Signs and symptoms consistent with tonsillitis.  We will go ahead and treat with dexamethasone.  Although PCR is negative for strep, his physical exam  is impressive and highly suggestive of bacterial etiology. We will provide a prescription for amoxicillin to be started tomorrow if the patient fails to improve with conservative management.  Patient was provided with anticipatory guidance, return precautions, and educational material. Encouraged the patient to return to the emergency department at any time if they begin to experience any new or worsening symptoms.       FINAL CLINICAL IMPRESSION(S) / ED DIAGNOSES   Final diagnoses:  Tonsillitis     Rx / DC Orders   ED Discharge Orders          Ordered    amoxicillin (AMOXIL) 500 MG capsule  2 times daily        10/22/21 2202             Note:  This document was prepared using Dragon voice recognition software and may include unintentional dictation errors.   Varney Daily, [place] 10/22/21  2204    Delton Prairie, MD 10/22/21 [phone]

## 2021-10-22 NOTE — ED Triage Notes (Signed)
Pt presents to ER c/o sore throat that has been getting progressively worse for last few days.  Pt states he has been getting over a cold for the last week and a half.  Pt states he has had some trouble swallowing.  Pt denies fevers in last few days.  Back of pt's throat appears to be very red and irritated.  Pt otherwise A&Ox4.  Speech clear.

## 2021-10-22 NOTE — Discharge Instructions (Addendum)
-  If your symptoms fail to improve after 24 to 48 hours, begin taking your antibiotics and take all of them as prescribed -Encourage gargling one-to-one mix of hydrogen peroxide and water regularly. -Take over-the-counter menthol/benzocaine lozenges as needed. -Treat pain with Tylenol/ibuprofen as needed. -Follow-up with the ENT provider listed above for evaluation of recurrent tonsillitis

## 2021-10-22 NOTE — ED Notes (Signed)
Pt complains of sore throat and difficulty swallowing. Pt throat red

## 2022-03-03 ENCOUNTER — Emergency Department: Payer: Self-pay

## 2022-03-03 ENCOUNTER — Emergency Department
Admission: EM | Admit: 2022-03-03 | Discharge: 2022-03-03 | Disposition: A | Discharge status: Home / Self Care | Payer: Self-pay | Attending: Emergency Medicine | Admitting: Emergency Medicine

## 2022-03-03 ENCOUNTER — Other Ambulatory Visit: Payer: Self-pay

## 2022-03-03 ENCOUNTER — Encounter: Payer: Self-pay | Admitting: Emergency Medicine

## 2022-03-03 DIAGNOSIS — R0789 Other chest pain: Secondary | ICD-10-CM | POA: Insufficient documentation

## 2022-03-03 DIAGNOSIS — S0990XA Unspecified injury of head, initial encounter: Secondary | ICD-10-CM | POA: Diagnosis present

## 2022-03-03 DIAGNOSIS — M25511 Pain in right shoulder: Secondary | ICD-10-CM | POA: Insufficient documentation

## 2022-03-03 DIAGNOSIS — S0083XA Contusion of other part of head, initial encounter: Secondary | ICD-10-CM | POA: Insufficient documentation

## 2022-03-03 DIAGNOSIS — Y92410 Unspecified street and highway as the place of occurrence of the external cause: Secondary | ICD-10-CM | POA: Insufficient documentation

## 2022-03-03 DIAGNOSIS — M542 Cervicalgia: Secondary | ICD-10-CM | POA: Insufficient documentation

## 2022-03-03 MED ORDER — MELOXICAM 15 MG PO TABS: 15.0000 mg | ORAL_TABLET | Freq: Every day | ORAL | 0 refills | Status: AC

## 2022-03-03 MED ORDER — MELOXICAM 7.5 MG PO TABS
15.0000 mg | ORAL_TABLET | Freq: Once | ORAL | Status: AC
  Administered 2022-03-03: 15 mg via ORAL
  Filled 2022-03-03: qty 2

## 2022-03-03 MED ORDER — METHOCARBAMOL 500 MG PO TABS
500.0000 mg | ORAL_TABLET | Freq: Four times a day (QID) | ORAL | 0 refills | Status: AC
Start: 2022-03-03 — End: ?

## 2022-03-03 MED ORDER — METHOCARBAMOL 500 MG PO TABS
1000.0000 mg | ORAL_TABLET | Freq: Once | ORAL | Status: AC
Start: 2022-03-03 — End: 2022-03-03
  Administered 2022-03-03: 1000 mg via ORAL
  Filled 2022-03-03: qty 2
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
## 2022-10-10 ENCOUNTER — Ambulatory Visit: Payer: Self-pay

## 2022-10-10 ENCOUNTER — Encounter (HOSPITAL_COMMUNITY): Payer: Self-pay

## 2022-10-10 ENCOUNTER — Ambulatory Visit (HOSPITAL_COMMUNITY)
Admission: EM | Admit: 2022-10-10 | Discharge: 2022-10-10 | Disposition: A | Discharge status: Home / Self Care | Payer: Self-pay | Attending: Internal Medicine | Admitting: Internal Medicine

## 2022-10-10 DIAGNOSIS — J029 Acute pharyngitis, unspecified: Secondary | ICD-10-CM | POA: Insufficient documentation

## 2022-10-10 LAB — POCT RAPID STREP A, ED / UC: Streptococcus, Group A Screen (Direct): NEGATIVE

## 2022-10-10 NOTE — ED Provider Notes (Signed)
[facility]    CSN: [id number] Arrival date & time: 10/10/22  1333      History   Chief Complaint Chief Complaint  Patient presents with   Sore Throat    HPI Keith Jacobson is a 27 y.o. male.   Patient presents urgent care for evaluation of sore throat, slight dry cough, and nasal congestion that started 3 days ago.  He felt fever/chills at the beginning of his illness that have since resolved.  He states cough is not significant and mostly due to sore throat and mucus production.  No recent antibiotic or steroid use.  He has not heard any wheezing to his chest and denies shortness of breath, chest pain, and heart palpitations.  No nausea, vomiting, dizziness, abdominal pain, body aches, or headache.  No ear pain, tinnitus, or difficulty swallowing.  Denies muffled voice sounds.  He has been taking ibuprofen for symptoms with some relief.  No known sick contacts with similar symptoms.  He does not have asthma or any other chronic respiratory problems and is not a smoker/denies drug use.  Last dose of ibuprofen was last night.  He has taken 3 over-the-counter COVID-19 tests all of which have been negative over the last 3 days.   Sore Throat    Past Medical History:  Diagnosis Date   Medical history non-contributory     There are no problems to display for this patient.   Past Surgical History:  Procedure Laterality Date   NO PAST SURGERIES     ORIF ANKLE FRACTURE Left 05/27/2015   Procedure: OPEN REDUCTION INTERNAL FIXATION (ORIF) ANKLE FRACTURE;  Surgeon: Gwyneth Revels, DPM;  Location: [facility] ORS;  Service: Podiatry;  Laterality: Left;       Home Medications    Prior to Admission medications   Medication Sig Start Date End Date Taking? Authorizing Provider  brompheniramine-pseudoephedrine-DM 30-2-10 MG/5ML syrup Take 5 mLs by mouth 4 (four) times daily as needed. Mix with 5 mL of viscous lidocaine for swish and swallow 04/01/21   Joni Reining, PA-C   famotidine (PEPCID) 20 MG tablet Take 1 tablet (20 mg total) by mouth 2 (two) times daily. Patient not taking: Reported on 04/19/2019 09/23/18   Irean Hong, MD  ibuprofen (ADVIL,MOTRIN) 600 MG tablet Take 1 tablet (600 mg total) by mouth every 8 (eight) hours as needed. Patient not taking: Reported on 04/19/2019 09/22/18   Joni Reining, PA-C  ibuprofen (ADVIL,MOTRIN) 800 MG tablet Take 1 tablet (800 mg total) by mouth every 8 (eight) hours as needed for moderate pain. Patient not taking: Reported on 04/19/2019 10/14/17   Joni Reining, PA-C  lidocaine (XYLOCAINE) 2 % solution Use as directed 5 mLs in the mouth or throat every 6 (six) hours as needed for mouth pain. Next with 5 mL of Bromfed-DM for swish and swallow 04/01/21   Joni Reining, PA-C  meloxicam (MOBIC) 15 MG tablet Take 1 tablet (15 mg total) by mouth daily. 03/03/22 03/03/23  Cuthriell, Delorise Royals, PA-C  methocarbamol (ROBAXIN) 500 MG tablet Take 1 tablet (500 mg total) by mouth 4 (four) times daily. 03/03/22   Cuthriell, Delorise Royals, PA-C  methylPREDNISolone (MEDROL DOSEPAK) 4 MG TBPK tablet Take Tapered dose as directed 04/01/21   Joni Reining, PA-C  naproxen (NAPROSYN) 500 MG tablet Take 1 tablet (500 mg total) by mouth 2 (two) times daily with a meal. Patient not taking: Reported on 04/19/2019 12/17/17   Joni Reining, PA-C  oxyCODONE-acetaminophen (  PERCOCET) 7.5-325 MG tablet Take 1 tablet by mouth every 6 (six) hours as needed for severe pain. Patient not taking: Reported on 04/19/2019 10/14/17   Sable Feil, PA-C  oxyCODONE-acetaminophen (PERCOCET) 7.5-325 MG tablet Take 1 tablet by mouth every 6 (six) hours as needed. Patient not taking: Reported on 04/19/2019 09/22/18   Sable Feil, PA-C  oxyCODONE-acetaminophen (PERCOCET/ROXICET) 5-325 MG per tablet Take 1 tablet by mouth every 4 (four) hours as needed for severe pain. Patient not taking: Reported on 04/19/2019 05/26/15   Gregor Hams, MD  predniSONE (DELTASONE) 20 MG tablet 3  tablets PO qd x 4 days Patient not taking: Reported on 04/19/2019 09/23/18   Paulette Blanch, MD  predniSONE (STERAPRED UNI-PAK 21 TAB) 10 MG (21) TBPK tablet Per packaging instructions 04/19/19   Nance Pear, MD  sulfamethoxazole-trimethoprim (BACTRIM DS) 800-160 MG tablet Take 1 tablet by mouth 2 (two) times daily. Patient not taking: Reported on 04/19/2019 07/28/18   Carrie Mew, MD    Family History History reviewed. No pertinent family history.  Social History Social History   Tobacco Use   Smoking status: Former    Packs/day: 0.00    Types: Cigars, Cigarettes   Smokeless tobacco: Never  Vaping Use   Vaping Use: Never used  Substance Use Topics   Alcohol use: No   Drug use: No     Allergies   Bactrim [sulfamethoxazole-trimethoprim]   Review of Systems Review of Systems Per HPI  Physical Exam Triage Vital Signs ED Triage Vitals [10/10/22 1418]  Enc Vitals Group     BP 125/79     Pulse Rate 86     Resp 12     Temp 98 F (36.7 C)     Temp Source Oral     SpO2 96 %     Weight      Height      Head Circumference      Peak Flow      Pain Score 8     Pain Loc      Pain Edu?      Excl. in [place]?    No data found.  Updated Vital Signs BP 125/79 (BP Location: Left Arm)   Pulse 86   Temp 98 F (36.7 C) (Oral)   Resp 12   SpO2 96%   Visual Acuity Right Eye Distance:   Left Eye Distance:   Bilateral Distance:    Right Eye Near:   Left Eye Near:    Bilateral Near:     Physical Exam Vitals and nursing note reviewed.  Constitutional:      Appearance: He is not ill-appearing or toxic-appearing.  HENT:     Head: Normocephalic and atraumatic.     Right Ear: Hearing, tympanic membrane, ear canal and external ear normal.     Left Ear: Hearing, tympanic membrane, ear canal and external ear normal.     Nose: Nose normal.     Mouth/Throat:     Lips: Pink.     Mouth: Mucous membranes are moist. No injury or oral lesions.     Tongue: No lesions. Tongue  does not deviate from midline.     Palate: No mass and lesions.     Pharynx: Uvula midline. Pharyngeal swelling and posterior oropharyngeal erythema present. No oropharyngeal exudate or uvula swelling.     Tonsils: No tonsillar exudate. 3+ on the right. 3+ on the left.     Comments: Significant erythema and swelling to the posterior  oropharynx with bilateral tonsillar swelling without exudate.  Airway is intact.  Phonation is normal.  No evidence of peritonsillar abscess.  Uvula is midline. Eyes:     General: Lids are normal. Vision grossly intact. Gaze aligned appropriately.     Extraocular Movements: Extraocular movements intact.     Conjunctiva/sclera: Conjunctivae normal.  Cardiovascular:     Rate and Rhythm: Normal rate and regular rhythm.     Heart sounds: Normal heart sounds, S1 normal and S2 normal.  Pulmonary:     Effort: Pulmonary effort is normal. No respiratory distress.     Breath sounds: Normal breath sounds and air entry.  Musculoskeletal:     Cervical back: Neck supple.  Skin:    General: Skin is warm and dry.     Capillary Refill: Capillary refill takes less than 2 seconds.     Findings: No rash.  Neurological:     General: No focal deficit present.     Mental Status: He is alert and oriented to person, place, and time. Mental status is at baseline.     Cranial Nerves: No dysarthria or facial asymmetry.  Psychiatric:        Mood and Affect: Mood normal.        Speech: Speech normal.        Behavior: Behavior normal.        Thought Content: Thought content normal.        Judgment: Judgment normal.      UC Treatments / Results  Labs (all labs ordered are listed, but only abnormal results are displayed) Labs Reviewed  CULTURE, GROUP A STREP [facility])  POCT RAPID STREP A, ED / UC    EKG   Radiology No results found.  Procedures Procedures (including critical care time)  Medications Ordered in UC Medications - No data to display  Initial Impression /  Assessment and Plan / UC Course  I have reviewed the triage vital signs and the nursing notes.  Pertinent labs & imaging results that were available during my care of the patient were reviewed by me and considered in my medical decision making (see chart for details).   1. Viral pharyngitis, sore throat Viral pharyngitis Group A strep testing in clinic is negative, throat culture is pending. Will call patient with positive results and treat based on throat culture if necessary. Deferred monospot testing today due to timing of illness and clinical presentation. Low suspicion for peritonsillar abscess, HEENT exam is stable and without red flag signs. Patient to continue using over the counter medications for symptomatic relief. May use salt water gargles and honey in warm water/warm tea for further symptomatic relief.  Given ibuprofen 800mg  in clinic for symptoms.   Discussed physical exam and available lab work findings in clinic with patient.  Counseled patient regarding appropriate use of medications and potential side effects for all medications recommended or prescribed today. Discussed red flag signs and symptoms of worsening condition,when to call the PCP office, return to urgent care, and when to seek higher level of care in the emergency department. Patient verbalizes understanding and agreement with plan. All questions answered. Patient discharged in stable condition.    Final Clinical Impressions(s) / UC Diagnoses   Final diagnoses:  Viral pharyngitis  Sore throat     Discharge Instructions      Your strep testing of your throat in the clinic is negative. Throat culture has been sent to the lab to further check for bacteria to the back of  your throat, staff will call you if this is positive in the next 2-3 days.   Use the following medicines to help with symptoms: - Ibuprofen 600mg  and/or Tylenol 1,000mg  every 6 hours with food as needed for aches/pains or fever/chills.  - 1  tablespoon of honey in warm water and/or salt water gargles may also help with symptoms.   If you develop any new or worsening symptoms, please return.  If your symptoms are severe, please go to the emergency room.  Follow-up with your primary care provider for further evaluation and management of your symptoms as well as ongoing wellness visits.  I hope you feel better!      ED Prescriptions   None    PDMP not reviewed this encounter.   Talbot Grumbling, [place] 10/10/22 1531

## 2022-10-10 NOTE — Discharge Instructions (Signed)
Your strep testing of your throat in the clinic is negative. Throat culture has been sent to the lab to further check for bacteria to the back of your throat, staff will call you if this is positive in the next 2-3 days.   Use the following medicines to help with symptoms: - Ibuprofen 600mg and/or Tylenol 1,000mg every 6 hours with food as needed for aches/pains or fever/chills.  - 1 tablespoon of honey in warm water and/or salt water gargles may also help with symptoms.   If you develop any new or worsening symptoms, please return.  If your symptoms are severe, please go to the emergency room.  Follow-up with your primary care provider for further evaluation and management of your symptoms as well as ongoing wellness visits.  I hope you feel better!   

## 2022-10-10 NOTE — ED Triage Notes (Signed)
Pt is here for tonsillitis, fever and headaches x 3days
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
## 2022-10-13 LAB — CULTURE, GROUP A STREP (THRC)

## 2023-07-19 ENCOUNTER — Encounter: Payer: Self-pay | Admitting: Emergency Medicine

## 2023-07-19 ENCOUNTER — Other Ambulatory Visit: Payer: Self-pay

## 2023-07-19 DIAGNOSIS — U071 COVID-19: Secondary | ICD-10-CM | POA: Insufficient documentation

## 2023-07-19 LAB — SARS CORONAVIRUS 2 BY RT PCR: SARS Coronavirus 2 by RT PCR: POSITIVE — AB

## 2023-07-19 LAB — GROUP A STREP BY PCR: Group A Strep by PCR: NOT DETECTED

## 2023-07-19 NOTE — ED Triage Notes (Signed)
Pt presents with "feeling sick" x 2 weeks.  Sore throat, body aches, and chills.  Pt reports he has trouble with his tonsils every year.

## 2023-07-20 ENCOUNTER — Emergency Department
Admission: EM | Admit: 2023-07-20 | Discharge: 2023-07-20 | Disposition: A | Discharge status: Home / Self Care | Payer: Self-pay | Attending: Emergency Medicine | Admitting: Emergency Medicine

## 2023-07-20 DIAGNOSIS — J029 Acute pharyngitis, unspecified: Secondary | ICD-10-CM

## 2023-07-20 DIAGNOSIS — U071 COVID-19: Secondary | ICD-10-CM

## 2023-07-20 MED ORDER — AMOXICILLIN 500 MG PO CAPS
500.0000 mg | ORAL_CAPSULE | Freq: Once | ORAL | Status: AC
  Administered 2023-07-20: 500 mg via ORAL
  Filled 2023-07-20: qty 1

## 2023-07-20 MED ORDER — DEXAMETHASONE 6 MG PO TABS
10.0000 mg | ORAL_TABLET | Freq: Once | ORAL | Status: AC
  Administered 2023-07-20: 10 mg via ORAL
  Filled 2023-07-20: qty 1

## 2023-07-20 MED ORDER — MAGIC MOUTHWASH
10.0000 mL | Freq: Once | ORAL | Status: AC
  Administered 2023-07-20: 10 mL via ORAL
  Filled 2023-07-20: qty 10

## 2023-07-20 MED ORDER — AMOXICILLIN 500 MG PO CAPS: 500.0000 mg | ORAL_CAPSULE | Freq: Three times a day (TID) | ORAL | 0 refills | Status: AC

## 2023-07-20 NOTE — ED Provider Notes (Signed)
[facility] Provider Note    Event Date/Time   First MD Initiated Contact with Patient 07/20/23 0022     (approximate)   History   Sore Throat and Generalized Body Aches   HPI  Keith Jacobson is a 27 y.o. male who presents to the ED from home with a chief complaint of sore throat, body aches and chills for 4 to 5 days.  Denies chest pain, shortness of breath, abdominal pain, nausea, vomiting or dizziness.     Past Medical History   Past Medical History:  Diagnosis Date   Medical history non-contributory      Active Problem List  There are no problems to display for this patient.    Past Surgical History   Past Surgical History:  Procedure Laterality Date   NO PAST SURGERIES     ORIF ANKLE FRACTURE Left 05/27/2015   Procedure: OPEN REDUCTION INTERNAL FIXATION (ORIF) ANKLE FRACTURE;  Surgeon: Gwyneth Revels, DPM;  Location: [facility] ORS;  Service: Podiatry;  Laterality: Left;     Home Medications   Prior to Admission medications   Medication Sig Start Date End Date Taking? Authorizing Provider  amoxicillin (AMOXIL) 500 MG capsule Take 1 capsule (500 mg total) by mouth 3 (three) times daily. 07/20/23  Yes Irean Hong, MD  brompheniramine-pseudoephedrine-DM 30-2-10 MG/5ML syrup Take 5 mLs by mouth 4 (four) times daily as needed. Mix with 5 mL of viscous lidocaine for swish and swallow 04/01/21   Joni Reining, PA-C  famotidine (PEPCID) 20 MG tablet Take 1 tablet (20 mg total) by mouth 2 (two) times daily. Patient not taking: Reported on 04/19/2019 09/23/18   Irean Hong, MD  ibuprofen (ADVIL,MOTRIN) 600 MG tablet Take 1 tablet (600 mg total) by mouth every 8 (eight) hours as needed. Patient not taking: Reported on 04/19/2019 09/22/18   Joni Reining, PA-C  ibuprofen (ADVIL,MOTRIN) 800 MG tablet Take 1 tablet (800 mg total) by mouth every 8 (eight) hours as needed for moderate pain. Patient not taking: Reported on 04/19/2019 10/14/17   Joni Reining,  PA-C  lidocaine (XYLOCAINE) 2 % solution Use as directed 5 mLs in the mouth or throat every 6 (six) hours as needed for mouth pain. Next with 5 mL of Bromfed-DM for swish and swallow 04/01/21   Joni Reining, PA-C  methocarbamol (ROBAXIN) 500 MG tablet Take 1 tablet (500 mg total) by mouth 4 (four) times daily. 03/03/22   Cuthriell, Delorise Royals, PA-C  methylPREDNISolone (MEDROL DOSEPAK) 4 MG TBPK tablet Take Tapered dose as directed 04/01/21   Joni Reining, PA-C  naproxen (NAPROSYN) 500 MG tablet Take 1 tablet (500 mg total) by mouth 2 (two) times daily with a meal. Patient not taking: Reported on 04/19/2019 12/17/17   Joni Reining, PA-C  oxyCODONE-acetaminophen (PERCOCET) 7.5-325 MG tablet Take 1 tablet by mouth every 6 (six) hours as needed for severe pain. Patient not taking: Reported on 04/19/2019 10/14/17   Joni Reining, PA-C  oxyCODONE-acetaminophen (PERCOCET) 7.5-325 MG tablet Take 1 tablet by mouth every 6 (six) hours as needed. Patient not taking: Reported on 04/19/2019 09/22/18   Joni Reining, PA-C  oxyCODONE-acetaminophen (PERCOCET/ROXICET) 5-325 MG per tablet Take 1 tablet by mouth every 4 (four) hours as needed for severe pain. Patient not taking: Reported on 04/19/2019 05/26/15   Darci Current, MD  predniSONE (DELTASONE) 20 MG tablet 3 tablets PO qd x 4 days Patient not taking: Reported on 04/19/2019 09/23/18  Irean Hong, MD  predniSONE (STERAPRED UNI-PAK 21 TAB) 10 MG (21) TBPK tablet Per packaging instructions 04/19/19   Phineas Semen, MD  sulfamethoxazole-trimethoprim (BACTRIM DS) 800-160 MG tablet Take 1 tablet by mouth 2 (two) times daily. Patient not taking: Reported on 04/19/2019 07/28/18   Sharman Cheek, MD     Allergies  Bactrim [sulfamethoxazole-trimethoprim]   Family History  History reviewed. No pertinent family history.   Physical Exam  Triage Vital Signs: ED Triage Vitals  Encounter Vitals Group     BP 07/19/23 1915 126/81     Systolic BP Percentile --       Diastolic BP Percentile --      Pulse Rate 07/19/23 1917 84     Resp 07/19/23 1915 18     Temp 07/19/23 1915 98.4 F (36.9 C)     Temp Source 07/19/23 1915 Oral     SpO2 07/19/23 1915 (!) 84 %     Weight --      Height --      Head Circumference --      Peak Flow --      Pain Score --      Pain Loc --      Pain Education --      Exclude from Growth Chart --     Updated Vital Signs: BP (!) 149/102 (BP Location: Left Arm)   Pulse 66   Temp 97.9 F (36.6 C) (Oral)   Resp 20   SpO2 94%    General: Awake, no distress.  CV:  RRR.  Good peripheral perfusion.  Resp:  Normal effort.  CTAB. Abd:  No distention.  Other:  Posterior oropharynx erythematous with symmetrically swollen tonsils bilaterally without exudates or peritonsillar abscess.  There is no hoarse or muffled voice.  There is no drooling.  Shotty anterior cervical lymphadenopathy.   ED Results / Procedures / Treatments  Labs (all labs ordered are listed, but only abnormal results are displayed) Labs Reviewed  SARS CORONAVIRUS 2 BY RT PCR - Abnormal; Notable for the following components:      Result Value   SARS Coronavirus 2 by RT PCR POSITIVE (*)    All other components within normal limits  GROUP A STREP BY PCR     EKG  None   RADIOLOGY None   Official radiology report(s): No results found.   PROCEDURES:  Critical Care performed: No  Procedures   MEDICATIONS ORDERED IN ED: Medications  magic mouthwash (has no administration in time range)  dexamethasone (DECADRON) tablet 10 mg (has no administration in time range)  amoxicillin (AMOXIL) capsule 500 mg (has no administration in time range)     IMPRESSION / MDM / ASSESSMENT AND PLAN / ED COURSE  I reviewed the triage vital signs and the nursing notes.                             27 year old male presenting with cold-like symptoms.  He is COVID-positive.  Will administer one-time Decadron, Magic mouthwash, start amoxicillin.  Strict  return precautions given.  Patient and family member verbalized understanding and agree with plan of care.  Patient's presentation is most consistent with acute, uncomplicated illness.   FINAL CLINICAL IMPRESSION(S) / ED DIAGNOSES   Final diagnoses:  Sore throat  COVID-19  Pharyngitis, unspecified etiology     Rx / DC Orders   ED Discharge Orders          Ordered  amoxicillin (AMOXIL) 500 MG capsule  3 times daily        07/20/23 0027             Note:  This document was prepared using Dragon voice recognition software and may include unintentional dictation errors.   Irean Hong, MD 07/20/23 [id number]

## 2023-07-20 NOTE — Discharge Instructions (Signed)
Take and finish antibiotic as prescribed.  Return to the ER for worsening symptoms, persistent vomiting, difficulty breathing or other concerns.
